# Patient Record
Sex: Male | Born: 1975 | Race: White | Hispanic: No | Marital: Single | Smoking: Current every day smoker
Health system: Southern US, Community
[De-identification: ages and names within clinical notes are randomized; demographics above are authoritative.]

## PROBLEM LIST (undated history)

## (undated) DIAGNOSIS — Z789 Other specified health status: Secondary | ICD-10-CM

## (undated) DIAGNOSIS — K760 Fatty (change of) liver, not elsewhere classified: Secondary | ICD-10-CM

## (undated) DIAGNOSIS — F1011 Alcohol abuse, in remission: Secondary | ICD-10-CM

## (undated) HISTORY — PX: KNEE SURGERY: SHX244

## (undated) HISTORY — DX: Alcohol abuse, in remission: F10.11

## (undated) HISTORY — DX: Fatty (change of) liver, not elsewhere classified: K76.0

---

## 2001-01-24 ENCOUNTER — Emergency Department (HOSPITAL_COMMUNITY): Admission: EM | Admit: 2001-01-24 | Discharge: 2001-01-24 | Payer: Self-pay | Admitting: Emergency Medicine

## 2001-09-28 ENCOUNTER — Emergency Department (HOSPITAL_COMMUNITY): Admission: EM | Admit: 2001-09-28 | Discharge: 2001-09-28 | Payer: Self-pay | Admitting: Emergency Medicine

## 2001-11-28 ENCOUNTER — Emergency Department (HOSPITAL_COMMUNITY): Admission: EM | Admit: 2001-11-28 | Discharge: 2001-11-28 | Payer: Self-pay | Admitting: *Deleted

## 2001-11-28 ENCOUNTER — Encounter: Payer: Self-pay | Admitting: *Deleted

## 2002-10-30 ENCOUNTER — Encounter: Payer: Self-pay | Admitting: Emergency Medicine

## 2002-10-30 ENCOUNTER — Emergency Department (HOSPITAL_COMMUNITY): Admission: AC | Admit: 2002-10-30 | Discharge: 2002-10-30 | Payer: Self-pay

## 2002-11-01 ENCOUNTER — Emergency Department (HOSPITAL_COMMUNITY): Admission: EM | Admit: 2002-11-01 | Discharge: 2002-11-01 | Payer: Self-pay

## 2002-11-06 ENCOUNTER — Emergency Department (HOSPITAL_COMMUNITY): Admission: EM | Admit: 2002-11-06 | Discharge: 2002-11-06 | Payer: Self-pay | Admitting: *Deleted

## 2002-12-04 ENCOUNTER — Emergency Department (HOSPITAL_COMMUNITY): Admission: EM | Admit: 2002-12-04 | Discharge: 2002-12-04 | Payer: Self-pay | Admitting: Emergency Medicine

## 2002-12-04 ENCOUNTER — Encounter: Payer: Self-pay | Admitting: Emergency Medicine

## 2005-01-27 ENCOUNTER — Emergency Department (HOSPITAL_COMMUNITY): Admission: EM | Admit: 2005-01-27 | Discharge: 2005-01-27 | Payer: Self-pay | Admitting: Emergency Medicine

## 2007-06-05 ENCOUNTER — Emergency Department (HOSPITAL_COMMUNITY): Admission: EM | Admit: 2007-06-05 | Discharge: 2007-06-05 | Payer: Self-pay | Admitting: Family Medicine

## 2008-08-10 ENCOUNTER — Emergency Department (HOSPITAL_COMMUNITY): Admission: EM | Admit: 2008-08-10 | Discharge: 2008-08-10 | Payer: Self-pay | Admitting: Family Medicine

## 2008-11-21 ENCOUNTER — Emergency Department (HOSPITAL_COMMUNITY): Admission: EM | Admit: 2008-11-21 | Discharge: 2008-11-21 | Payer: Self-pay | Admitting: Family Medicine

## 2008-12-23 ENCOUNTER — Encounter: Payer: Self-pay | Admitting: Emergency Medicine

## 2008-12-24 ENCOUNTER — Inpatient Hospital Stay (HOSPITAL_COMMUNITY): Admission: RE | Admit: 2008-12-24 | Discharge: 2008-12-27 | Payer: Self-pay

## 2008-12-27 ENCOUNTER — Inpatient Hospital Stay (HOSPITAL_COMMUNITY): Admission: AD | Admit: 2008-12-27 | Discharge: 2008-12-30 | Payer: Self-pay | Admitting: Psychiatry

## 2008-12-27 ENCOUNTER — Ambulatory Visit: Payer: Self-pay | Admitting: Psychiatry

## 2008-12-31 ENCOUNTER — Ambulatory Visit (HOSPITAL_COMMUNITY): Payer: Self-pay | Admitting: Psychology

## 2010-07-11 LAB — URINALYSIS, ROUTINE W REFLEX MICROSCOPIC
Bilirubin Urine: NEGATIVE
Glucose, UA: NEGATIVE mg/dL
Hgb urine dipstick: NEGATIVE
Ketones, ur: NEGATIVE mg/dL
Nitrite: NEGATIVE
Protein, ur: NEGATIVE mg/dL
Specific Gravity, Urine: 1.01 (ref 1.005–1.030)
Urobilinogen, UA: 1 mg/dL (ref 0.0–1.0)
pH: 5.5 (ref 5.0–8.0)

## 2010-07-11 LAB — DIFFERENTIAL
Basophils Absolute: 0.4 10*3/uL — ABNORMAL HIGH (ref 0.0–0.1)
Basophils Relative: 3 % — ABNORMAL HIGH (ref 0–1)
Eosinophils Absolute: 0 10*3/uL (ref 0.0–0.7)
Eosinophils Relative: 0 % (ref 0–5)
Lymphocytes Relative: 10 % — ABNORMAL LOW (ref 12–46)
Lymphs Abs: 1.5 10*3/uL (ref 0.7–4.0)
Monocytes Absolute: 1.9 10*3/uL — ABNORMAL HIGH (ref 0.1–1.0)
Monocytes Relative: 12 % (ref 3–12)
Neutro Abs: 12 10*3/uL — ABNORMAL HIGH (ref 1.7–7.7)
Neutrophils Relative %: 75 % (ref 43–77)

## 2010-07-11 LAB — BASIC METABOLIC PANEL
BUN: 11 mg/dL (ref 6–23)
CO2: 24 mEq/L (ref 19–32)
Calcium: 9.1 mg/dL (ref 8.4–10.5)
Chloride: 102 mEq/L (ref 96–112)
Creatinine, Ser: 0.82 mg/dL (ref 0.4–1.5)
GFR calc Af Amer: >60 mL/min (ref >60)
GFR calc non Af Amer: >60 mL/min (ref >60)
Glucose, Bld: 143 mg/dL — ABNORMAL HIGH (ref 70–99)
Potassium: 3.8 mEq/L (ref 3.5–5.1)
Sodium: 136 mEq/L (ref 135–145)

## 2010-07-11 LAB — CBC
HCT: 45 % (ref 39.0–52.0)
HCT: 48 % (ref 39.0–52.0)
Hemoglobin: 15.3 g/dL (ref 13.0–17.0)
Hemoglobin: 16.4 g/dL (ref 13.0–17.0)
MCHC: 33.9 g/dL (ref 30.0–36.0)
MCHC: 34.3 g/dL (ref 30.0–36.0)
MCV: 89.3 fL (ref 78.0–100.0)
MCV: 89.6 fL (ref 78.0–100.0)
Platelets: 182 10*3/uL (ref 150–400)
Platelets: 205 10*3/uL (ref 150–400)
RBC: 5.04 MIL/uL (ref 4.22–5.81)
RBC: 5.36 MIL/uL (ref 4.22–5.81)
RDW: 13.6 % (ref 11.5–15.5)
RDW: 12.8 % (ref 11.5–15.5)
WBC: 8.6 10*3/uL (ref 4.0–10.5)
WBC: 15.8 10*3/uL — ABNORMAL HIGH (ref 4.0–10.5)

## 2010-07-11 LAB — URINE CULTURE
Colony Count: NO GROWTH
Culture: NO GROWTH

## 2010-07-11 LAB — RAPID URINE DRUG SCREEN, HOSP PERFORMED
Amphetamines: NOT DETECTED
Barbiturates: NOT DETECTED
Benzodiazepines: POSITIVE — AB
Cocaine: NOT DETECTED
Opiates: NOT DETECTED
Tetrahydrocannabinol: POSITIVE — AB

## 2010-07-11 LAB — ETHANOL: Alcohol, Ethyl (B): <5 mg/dL (ref 0–10)

## 2010-07-11 LAB — TRICYCLICS SCREEN, URINE: TCA Scrn: NOT DETECTED

## 2012-06-17 ENCOUNTER — Emergency Department (HOSPITAL_COMMUNITY)
Admission: EM | Admit: 2012-06-17 | Discharge: 2012-06-17 | Disposition: A | Discharge status: Home / Self Care | Payer: Self-pay | Attending: Emergency Medicine | Admitting: Emergency Medicine

## 2012-06-17 ENCOUNTER — Encounter (HOSPITAL_COMMUNITY): Payer: Self-pay | Admitting: Cardiology

## 2012-06-17 DIAGNOSIS — K113 Abscess of salivary gland: Secondary | ICD-10-CM | POA: Insufficient documentation

## 2012-06-17 DIAGNOSIS — F17200 Nicotine dependence, unspecified, uncomplicated: Secondary | ICD-10-CM | POA: Insufficient documentation

## 2012-06-17 DIAGNOSIS — K029 Dental caries, unspecified: Secondary | ICD-10-CM | POA: Insufficient documentation

## 2012-06-17 MED ORDER — CEPHALEXIN 500 MG PO CAPS: 500.0000 mg | ORAL_CAPSULE | Freq: Four times a day (QID) | ORAL | Status: DC

## 2012-06-17 NOTE — ED Provider Notes (Signed)
History     CSN: [id number]  Arrival date & time 06/17/12  1191   First MD Initiated Contact with Patient 06/17/12 1116      Chief Complaint  Patient presents with  . Abscess    (Consider location/radiation/quality/duration/timing/severity/associated sxs/prior treatment) HPI Comments: Patient presents today with a chief complaint of a right submandibular mass.  Mass has been present for the past 2 weeks and is gradually becoming larger.  Area is tender.  He has taken Tylenol for the pain without relief.  He has never had anything like this before.  No fever or chills.  No trismus.  No difficulty swallowing.  No drooling.  No dental pain..    The history is provided by the patient.    History reviewed. No pertinent past medical history.  History reviewed. No pertinent past surgical history.  History reviewed. No pertinent family history.  History  Substance Use Topics  . Smoking status: Current Every Day Smoker  . Smokeless tobacco: Not on file  . Alcohol Use: Yes      Review of Systems  Constitutional: Negative for fever and chills.  HENT: Positive for dental problem. Negative for drooling, trouble swallowing, neck pain and neck stiffness.     Allergies  Review of patient's allergies indicates no known allergies.  Home Medications   Current Outpatient Rx  Name  Route  Sig  Dispense  Refill  . acetaminophen (TYLENOL) 325 MG tablet   Oral   Take 650 mg by mouth every 6 (six) hours as needed for pain.         Marland Kitchen ibuprofen (ADVIL,MOTRIN) 200 MG tablet   Oral   Take 400 mg by mouth every 6 (six) hours as needed for pain.           BP 126/82  Pulse 72  Temp(Src) 97.5 F (36.4 C) (Oral)  Resp 18  SpO2 97%  Physical Exam  Nursing note and vitals reviewed. Constitutional: He appears well-developed and well-nourished.  HENT:  Head: Normocephalic and atraumatic. No trismus in the jaw.  Mouth/Throat: Uvula is midline, oropharynx is clear and moist and  mucous membranes are normal. No oral lesions. Dental caries present. No dental abscesses or edematous.  Approximately 3 cm salivary gland abscess in the right submandibular region No trismus No dental abscess No tongue elevation or sublingual tenderness. No submental or submandibular lymphadenopathy  Neck: Normal range of motion. Neck supple.  Cardiovascular: Normal rate, regular rhythm and normal heart sounds.   Pulmonary/Chest: Effort normal and breath sounds normal.  Musculoskeletal: Normal range of motion.  Neurological: He is alert.  Skin: Skin is warm and dry.  Psychiatric: He has a normal mood and affect.    ED Course  Procedures (including critical care time)  Labs Reviewed - No data to display No results found.   No diagnosis found.  Patient also evaluated by Dr. Effie Shy  MDM  Patient presenting today with a submandibular mass.  Appearance consistent with salivary gland abscess.  Patient afebrile.  No trismus.  No difficulty swallowing.  Patient started on Keflex and instructed to suck on lemon candies.  Patient also given referral to ENT if symptoms do not improve.        Pascal Lux [place], PA-C 06/18/12 1857

## 2012-06-17 NOTE — ED Notes (Signed)
Pt reports abscess to the right side of his neck for the past 2 weeks. States he has taken OTC medication without relief. Denies any difficulty swallowing. No fevers at home.

## 2012-06-19 NOTE — ED Provider Notes (Signed)
Medical screening examination/treatment/procedure(s) were performed by non-physician practitioner and as supervising physician I was immediately available for consultation/collaboration.  Elliott L Wentz, MD 06/19/12 0859 

## 2014-04-01 ENCOUNTER — Emergency Department (HOSPITAL_COMMUNITY): Payer: Self-pay

## 2014-04-01 ENCOUNTER — Encounter (HOSPITAL_COMMUNITY): Payer: Self-pay | Admitting: Physical Medicine and Rehabilitation

## 2014-04-01 ENCOUNTER — Inpatient Hospital Stay (HOSPITAL_COMMUNITY)
Admission: EM | Admit: 2014-04-01 | Discharge: 2014-04-03 | DRG: 605 | Disposition: A | Discharge status: Home / Self Care | Payer: Self-pay | Attending: Surgery | Admitting: Surgery

## 2014-04-01 DIAGNOSIS — F1290 Cannabis use, unspecified, uncomplicated: Secondary | ICD-10-CM | POA: Diagnosis present

## 2014-04-01 DIAGNOSIS — Z9119 Patient's noncompliance with other medical treatment and regimen: Secondary | ICD-10-CM | POA: Diagnosis present

## 2014-04-01 DIAGNOSIS — S129XXA Fracture of neck, unspecified, initial encounter: Secondary | ICD-10-CM | POA: Diagnosis present

## 2014-04-01 DIAGNOSIS — S0101XA Laceration without foreign body of scalp, initial encounter: Principal | ICD-10-CM | POA: Diagnosis present

## 2014-04-01 DIAGNOSIS — F17210 Nicotine dependence, cigarettes, uncomplicated: Secondary | ICD-10-CM | POA: Diagnosis present

## 2014-04-01 DIAGNOSIS — S0292XA Unspecified fracture of facial bones, initial encounter for closed fracture: Secondary | ICD-10-CM

## 2014-04-01 DIAGNOSIS — S028XXA Fractures of other specified skull and facial bones, initial encounter for closed fracture: Secondary | ICD-10-CM | POA: Diagnosis present

## 2014-04-01 DIAGNOSIS — S12200A Unspecified displaced fracture of third cervical vertebra, initial encounter for closed fracture: Secondary | ICD-10-CM | POA: Diagnosis present

## 2014-04-01 HISTORY — DX: Other specified health status: Z78.9

## 2014-04-01 LAB — COMPREHENSIVE METABOLIC PANEL
ALBUMIN: 4.4 g/dL (ref 3.5–5.2)
ALT: 32 U/L (ref 0–53)
ANION GAP: 10 (ref 5–15)
AST: 48 U/L — ABNORMAL HIGH (ref 0–37)
Alkaline Phosphatase: 71 U/L (ref 39–117)
BUN: 7 mg/dL (ref 6–23)
CALCIUM: 9 mg/dL (ref 8.4–10.5)
CO2: 23 mmol/L (ref 19–32)
CREATININE: 0.77 mg/dL (ref 0.50–1.35)
Chloride: 104 mEq/L (ref 96–112)
GFR calc Af Amer: >90 mL/min (ref >90)
GFR calc non Af Amer: >90 mL/min (ref >90)
GLUCOSE: 91 mg/dL (ref 70–99)
Potassium: 3.6 mmol/L (ref 3.5–5.1)
Sodium: 137 mmol/L (ref 135–145)
TOTAL PROTEIN: 6.9 g/dL (ref 6.0–8.3)
Total Bilirubin: 0.2 mg/dL — ABNORMAL LOW (ref 0.3–1.2)

## 2014-04-01 LAB — I-STAT CHEM 8, ED
BUN: 8 mg/dL (ref 6–23)
CALCIUM ION: 1.11 mmol/L — AB (ref 1.12–1.23)
Chloride: 102 mEq/L (ref 96–112)
Creatinine, Ser: 1.1 mg/dL (ref 0.50–1.35)
GLUCOSE: 90 mg/dL (ref 70–99)
HEMATOCRIT: 49 % (ref 39.0–52.0)
Hemoglobin: 16.7 g/dL (ref 13.0–17.0)
Potassium: 3.5 mmol/L (ref 3.5–5.1)
Sodium: 140 mmol/L (ref 135–145)
TCO2: 20 mmol/L (ref 0–100)

## 2014-04-01 LAB — CBC
HEMATOCRIT: 43.6 % (ref 39.0–52.0)
HEMOGLOBIN: 15 g/dL (ref 13.0–17.0)
MCH: 29.5 pg (ref 26.0–34.0)
MCHC: 34.4 g/dL (ref 30.0–36.0)
MCV: 85.8 fL (ref 78.0–100.0)
Platelets: 254 10*3/uL (ref 150–400)
RBC: 5.08 MIL/uL (ref 4.22–5.81)
RDW: 13.1 % (ref 11.5–15.5)
WBC: 11 10*3/uL — AB (ref 4.0–10.5)

## 2014-04-01 LAB — SAMPLE TO BLOOD BANK

## 2014-04-01 LAB — PROTIME-INR
INR: 1.03 (ref 0.00–1.49)
PROTHROMBIN TIME: 13.6 s (ref 11.6–15.2)

## 2014-04-01 LAB — ETHANOL: ALCOHOL ETHYL (B): 200 mg/dL — AB (ref 0–9)

## 2014-04-01 MED ORDER — CEFAZOLIN SODIUM-DEXTROSE 2-3 GM-% IV SOLR
INTRAVENOUS | Status: AC
  Filled 2014-04-01: qty 50

## 2014-04-01 MED ORDER — DEXAMETHASONE SODIUM PHOSPHATE 10 MG/ML IJ SOLN
10.0000 mg | Freq: Once | INTRAMUSCULAR | Status: AC
  Administered 2014-04-01: 10 mg via INTRAVENOUS
  Filled 2014-04-01: qty 1

## 2014-04-01 MED ORDER — FENTANYL CITRATE 0.05 MG/ML IJ SOLN
50.0000 ug | Freq: Once | INTRAMUSCULAR | Status: AC
  Administered 2014-04-01: 50 ug via INTRAVENOUS

## 2014-04-01 MED ORDER — FENTANYL CITRATE 0.05 MG/ML IJ SOLN
25.0000 ug | Freq: Once | INTRAMUSCULAR | Status: AC
  Administered 2014-04-01: 25 ug via INTRAVENOUS
  Filled 2014-04-01: qty 2

## 2014-04-01 MED ORDER — MORPHINE SULFATE 4 MG/ML IJ SOLN
4.0000 mg | INTRAMUSCULAR | Status: DC | PRN
  Administered 2014-04-02 – 2014-04-03 (×9): 4 mg via INTRAVENOUS
  Filled 2014-04-01 (×9): qty 1

## 2014-04-01 MED ORDER — HYDROMORPHONE HCL 1 MG/ML IJ SOLN
1.0000 mg | INTRAMUSCULAR | Status: DC | PRN
  Administered 2014-04-01: 1 mg via INTRAVENOUS

## 2014-04-01 MED ORDER — HYDROMORPHONE HCL 1 MG/ML IJ SOLN
INTRAMUSCULAR | Status: AC
  Filled 2014-04-01: qty 1

## 2014-04-01 MED ORDER — SODIUM CHLORIDE 0.9 % IV SOLN
Freq: Once | INTRAVENOUS | Status: AC
  Administered 2014-04-01: 20:00:00 via INTRAVENOUS

## 2014-04-01 MED ORDER — IOHEXOL 350 MG/ML SOLN
100.0000 mL | Freq: Once | INTRAVENOUS | Status: AC | PRN
  Administered 2014-04-01: 100 mL via INTRAVENOUS

## 2014-04-01 MED ORDER — CEFAZOLIN SODIUM 1-5 GM-% IV SOLN
1.0000 g | Freq: Once | INTRAVENOUS | Status: AC
  Administered 2014-04-01: 1 g via INTRAVENOUS
  Filled 2014-04-01: qty 50

## 2014-04-01 MED ORDER — SODIUM CHLORIDE 0.9 % IV SOLN
INTRAVENOUS | Status: DC
  Administered 2014-04-02: 05:00:00 via INTRAVENOUS

## 2014-04-01 MED ORDER — LIDOCAINE-EPINEPHRINE 1 %-1:100000 IJ SOLN
10.0000 mL | Freq: Once | INTRAMUSCULAR | Status: AC
  Administered 2014-04-01: 1 mL
  Filled 2014-04-01: qty 1

## 2014-04-01 MED ORDER — MORPHINE SULFATE 2 MG/ML IJ SOLN
2.0000 mg | INTRAMUSCULAR | Status: DC | PRN
  Administered 2014-04-02 (×2): 2 mg via INTRAVENOUS
  Filled 2014-04-01 (×2): qty 1

## 2014-04-01 MED ORDER — TETANUS-DIPHTHERIA TOXOIDS TD 5-2 LFU IM INJ
0.5000 mL | INJECTION | Freq: Once | INTRAMUSCULAR | Status: AC
  Administered 2014-04-01: 0.5 mL via INTRAMUSCULAR
  Filled 2014-04-01: qty 0.5
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
## 2014-04-01 NOTE — ED Notes (Signed)
Pt transported to CT scan and x-ray. 
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
## 2014-04-01 NOTE — ED Notes (Signed)
MD at bedside. 

## 2014-04-02 ENCOUNTER — Encounter (HOSPITAL_COMMUNITY): Payer: Self-pay | Admitting: *Deleted

## 2014-04-02 LAB — BASIC METABOLIC PANEL
Anion gap: 11 (ref 5–15)
BUN: 9 mg/dL (ref 6–23)
CO2: 22 mmol/L (ref 19–32)
CREATININE: 0.77 mg/dL (ref 0.50–1.35)
Calcium: 8.8 mg/dL (ref 8.4–10.5)
Chloride: 105 mEq/L (ref 96–112)
Glucose, Bld: 120 mg/dL — ABNORMAL HIGH (ref 70–99)
POTASSIUM: 4.3 mmol/L (ref 3.5–5.1)
Sodium: 138 mmol/L (ref 135–145)

## 2014-04-02 LAB — CBC
HCT: 42.6 % (ref 39.0–52.0)
Hemoglobin: 14.2 g/dL (ref 13.0–17.0)
MCH: 28.7 pg (ref 26.0–34.0)
MCHC: 33.3 g/dL (ref 30.0–36.0)
MCV: 86.2 fL (ref 78.0–100.0)
Platelets: 223 10*3/uL (ref 150–400)
RBC: 4.94 MIL/uL (ref 4.22–5.81)
RDW: 13.2 % (ref 11.5–15.5)
WBC: 14 10*3/uL — ABNORMAL HIGH (ref 4.0–10.5)

## 2014-04-02 LAB — URINALYSIS, ROUTINE W REFLEX MICROSCOPIC
Bilirubin Urine: NEGATIVE
GLUCOSE, UA: NEGATIVE mg/dL
Hgb urine dipstick: NEGATIVE
Ketones, ur: NEGATIVE mg/dL
Leukocytes, UA: NEGATIVE
NITRITE: NEGATIVE
PH: 5 (ref 5.0–8.0)
Protein, ur: NEGATIVE mg/dL
Specific Gravity, Urine: 1.01 (ref 1.005–1.030)
Urobilinogen, UA: 0.2 mg/dL (ref 0.0–1.0)

## 2014-04-02 LAB — CDS SEROLOGY

## 2014-04-02 LAB — MRSA PCR SCREENING: MRSA by PCR: POSITIVE — AB

## 2014-04-02 MED ORDER — PNEUMOCOCCAL VAC POLYVALENT 25 MCG/0.5ML IJ INJ
0.5000 mL | INJECTION | INTRAMUSCULAR | Status: DC
  Filled 2014-04-02: qty 0.5

## 2014-04-02 MED ORDER — MUPIROCIN 2 % EX OINT
1.0000 "application " | TOPICAL_OINTMENT | Freq: Two times a day (BID) | CUTANEOUS | Status: DC
  Administered 2014-04-02 – 2014-04-03 (×2): 1 via NASAL
  Filled 2014-04-02 (×2): qty 22

## 2014-04-02 MED ORDER — SODIUM CHLORIDE 0.9 % IV SOLN
INTRAVENOUS | Status: DC
  Administered 2014-04-02: 17:00:00 via INTRAVENOUS

## 2014-04-02 MED ORDER — ONDANSETRON HCL 4 MG/2ML IJ SOLN: 4.0000 mg | Freq: Four times a day (QID) | INTRAMUSCULAR | Status: DC | PRN

## 2014-04-02 MED ORDER — INFLUENZA VAC SPLIT QUAD 0.5 ML IM SUSY
0.5000 mL | PREFILLED_SYRINGE | INTRAMUSCULAR | Status: DC
  Filled 2014-04-02: qty 0.5

## 2014-04-02 MED ORDER — ONDANSETRON HCL 4 MG PO TABS: 4.0000 mg | ORAL_TABLET | Freq: Four times a day (QID) | ORAL | Status: DC | PRN

## 2014-04-02 MED ORDER — CHLORHEXIDINE GLUCONATE CLOTH 2 % EX PADS
6.0000 | MEDICATED_PAD | Freq: Every day | CUTANEOUS | Status: DC
  Administered 2014-04-02 – 2014-04-03 (×2): 6 via TOPICAL

## 2014-04-02 NOTE — Clinical Social Work Note (Signed)
CSW attempted to see the pt. The pt requested the CSW to come back at a later time. Pt informed the CSW that he wanted to get some sleep. CSW will revisit the pt.   Levi Klaiber, MSW, LCSWA [phone]
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
## 2014-04-02 NOTE — Progress Notes (Signed)
Patient removed his aspen collar against this nurse advice since spine status not cleared yet, and patient stated,"I cannot sleep with this collar on.  I am ok.  I need sleep".
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
## 2014-04-02 NOTE — Progress Notes (Signed)
At 0027, patient transferred from ER via stretcher by ER RN. Patient confused and stating he needed to urinate. Bladder scan performed >999. Dr. Magnus IvanBlackman notified. In and out cath performed with 1250cc clear, yellow urine returned. Patient more comfortable. Family updated and at bedside. Oriented to unit and room. Patient instructed on callbell and placed at side. Bed alarm on. Will continue to monitor.
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
## 2014-04-03 DIAGNOSIS — S129XXA Fracture of neck, unspecified, initial encounter: Secondary | ICD-10-CM | POA: Diagnosis present

## 2014-04-03 DIAGNOSIS — S0101XA Laceration without foreign body of scalp, initial encounter: Secondary | ICD-10-CM | POA: Diagnosis present

## 2014-04-03 MED ORDER — MORPHINE SULFATE 2 MG/ML IJ SOLN: 2.0000 mg | INTRAMUSCULAR | Status: DC | PRN

## 2014-04-03 MED ORDER — TOBRAMYCIN-DEXAMETHASONE 0.3-0.1 % OP OINT: 1.0000 "application " | TOPICAL_OINTMENT | Freq: Two times a day (BID) | OPHTHALMIC | Status: DC

## 2014-04-03 MED ORDER — HYDROCODONE-ACETAMINOPHEN 10-325 MG PO TABS
0.5000 | ORAL_TABLET | ORAL | Status: DC | PRN
  Administered 2014-04-03 (×2): 2 via ORAL
  Filled 2014-04-03 (×2): qty 2

## 2014-04-03 MED ORDER — HYDROCODONE-ACETAMINOPHEN 10-325 MG PO TABS: 1.0000 | ORAL_TABLET | ORAL | Status: DC | PRN

## 2014-04-03 NOTE — Progress Notes (Signed)
Patient and his sister given discharge instructions.  They verbalized understanding of all instructions and follow-up information.  Med assistance paperwork was already given to pt by Ilean SkillMichele Bryson, RN Case Mgr.  Dressings changed on Rt elbow and Rt knee before discharge.  No questions or concerns at this time.  Pt taken down via wheelchair to go home with his sister.

## 2014-04-03 NOTE — Discharge Instructions (Signed)
Wear neck collar at all times.  No driving while taking hydrocodone.
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
## 2014-04-03 NOTE — Progress Notes (Signed)
Put back on aspen collar to patient.
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
## 2014-04-09 ENCOUNTER — Telehealth (HOSPITAL_COMMUNITY): Payer: Self-pay

## 2014-04-09 NOTE — Telephone Encounter (Signed)
Pt called to say he was itchy and had a rash with the hydrocodone. I offered to switch him to something non-narcotic but the patient didn't think it was too bad. He is going to take some benadryl with it. I instructed him to stop taking it immediately if things got worse and call.

## 2015-10-20 ENCOUNTER — Inpatient Hospital Stay (HOSPITAL_COMMUNITY)
Admission: EM | Admit: 2015-10-20 | Discharge: 2015-10-23 | DRG: 494 | Disposition: A | Discharge status: Home / Self Care | Payer: Medicaid Other | Attending: Orthopedic Surgery | Admitting: Orthopedic Surgery

## 2015-10-20 DIAGNOSIS — F1290 Cannabis use, unspecified, uncomplicated: Secondary | ICD-10-CM | POA: Diagnosis present

## 2015-10-20 DIAGNOSIS — Z8781 Personal history of (healed) traumatic fracture: Secondary | ICD-10-CM

## 2015-10-20 DIAGNOSIS — F10129 Alcohol abuse with intoxication, unspecified: Secondary | ICD-10-CM | POA: Diagnosis present

## 2015-10-20 DIAGNOSIS — Y906 Blood alcohol level of 120-199 mg/100 ml: Secondary | ICD-10-CM | POA: Diagnosis present

## 2015-10-20 DIAGNOSIS — Z9889 Other specified postprocedural states: Secondary | ICD-10-CM

## 2015-10-20 DIAGNOSIS — S82209A Unspecified fracture of shaft of unspecified tibia, initial encounter for closed fracture: Secondary | ICD-10-CM | POA: Diagnosis present

## 2015-10-20 DIAGNOSIS — S82202B Unspecified fracture of shaft of left tibia, initial encounter for open fracture type I or II: Secondary | ICD-10-CM | POA: Diagnosis present

## 2015-10-20 DIAGNOSIS — S82402B Unspecified fracture of shaft of left fibula, initial encounter for open fracture type I or II: Principal | ICD-10-CM | POA: Diagnosis present

## 2015-10-20 DIAGNOSIS — F17210 Nicotine dependence, cigarettes, uncomplicated: Secondary | ICD-10-CM | POA: Diagnosis present

## 2015-10-20 MED ORDER — SODIUM CHLORIDE 0.9 % IV BOLUS (SEPSIS)
2000.0000 mL | Freq: Once | INTRAVENOUS | Status: AC
Start: 2015-10-21 — End: 2015-10-21
  Administered 2015-10-21: 2000 mL via INTRAVENOUS

## 2015-10-20 MED ORDER — FENTANYL CITRATE (PF) 100 MCG/2ML IJ SOLN
100.0000 ug | Freq: Once | INTRAMUSCULAR | Status: AC
  Administered 2015-10-21: 100 ug via INTRAVENOUS
  Filled 2015-10-20: qty 2
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
## 2015-10-21 ENCOUNTER — Emergency Department (HOSPITAL_COMMUNITY): Payer: Medicaid Other

## 2015-10-21 ENCOUNTER — Encounter (HOSPITAL_COMMUNITY): Payer: Self-pay | Admitting: Emergency Medicine

## 2015-10-21 ENCOUNTER — Inpatient Hospital Stay (HOSPITAL_COMMUNITY): Payer: Medicaid Other | Admitting: Certified Registered"

## 2015-10-21 ENCOUNTER — Inpatient Hospital Stay (HOSPITAL_COMMUNITY): Payer: Medicaid Other

## 2015-10-21 ENCOUNTER — Encounter (HOSPITAL_COMMUNITY)
Admission: EM | Disposition: A | Discharge status: Home / Self Care | Payer: Self-pay | Source: Home / Self Care | Attending: Orthopedic Surgery

## 2015-10-21 DIAGNOSIS — F1290 Cannabis use, unspecified, uncomplicated: Secondary | ICD-10-CM | POA: Diagnosis present

## 2015-10-21 DIAGNOSIS — S82202B Unspecified fracture of shaft of left tibia, initial encounter for open fracture type I or II: Secondary | ICD-10-CM | POA: Diagnosis present

## 2015-10-21 DIAGNOSIS — S82402B Unspecified fracture of shaft of left fibula, initial encounter for open fracture type I or II: Secondary | ICD-10-CM | POA: Diagnosis present

## 2015-10-21 DIAGNOSIS — F17210 Nicotine dependence, cigarettes, uncomplicated: Secondary | ICD-10-CM | POA: Diagnosis present

## 2015-10-21 DIAGNOSIS — Y906 Blood alcohol level of 120-199 mg/100 ml: Secondary | ICD-10-CM | POA: Diagnosis present

## 2015-10-21 DIAGNOSIS — S82209A Unspecified fracture of shaft of unspecified tibia, initial encounter for closed fracture: Secondary | ICD-10-CM | POA: Diagnosis present

## 2015-10-21 DIAGNOSIS — F10129 Alcohol abuse with intoxication, unspecified: Secondary | ICD-10-CM | POA: Diagnosis present

## 2015-10-21 HISTORY — PX: I & D EXTREMITY: SHX5045

## 2015-10-21 HISTORY — PX: TIBIA IM NAIL INSERTION: SHX2516

## 2015-10-21 LAB — CBG MONITORING, ED: Glucose-Capillary: 101 mg/dL — ABNORMAL HIGH (ref 65–99)

## 2015-10-21 LAB — CBC WITH DIFFERENTIAL/PLATELET
EOS PCT: 2 %
HCT: 44.9 % (ref 39.0–52.0)
Hemoglobin: 15.1 g/dL (ref 13.0–17.0)
Lymphocytes Relative: 40 %
MCH: 29.4 pg (ref 26.0–34.0)
MCHC: 33.6 g/dL (ref 30.0–36.0)
MCV: 87.4 fL (ref 78.0–100.0)
MONOS PCT: 6 %
NEUTROS PCT: 52 %
PLATELETS: 301 10*3/uL (ref 150–400)
RBC: 5.14 MIL/uL (ref 4.22–5.81)
RDW: 13.2 % (ref 11.5–15.5)
WBC: 14.4 10*3/uL — AB (ref 4.0–10.5)

## 2015-10-21 LAB — COMPREHENSIVE METABOLIC PANEL
ALBUMIN: 4.5 g/dL (ref 3.5–5.0)
ALT: 27 U/L (ref 17–63)
ANION GAP: 11 (ref 5–15)
AST: 50 U/L — AB (ref 15–41)
Alkaline Phosphatase: 70 U/L (ref 38–126)
BUN: 15 mg/dL (ref 6–20)
CALCIUM: 8.9 mg/dL (ref 8.9–10.3)
CHLORIDE: 107 mmol/L (ref 101–111)
CO2: 22 mmol/L (ref 22–32)
Creatinine, Ser: 0.79 mg/dL (ref 0.61–1.24)
GFR calc Af Amer: >60 mL/min (ref >60)
Glucose, Bld: 96 mg/dL (ref 65–99)
POTASSIUM: 4.1 mmol/L (ref 3.5–5.1)
Sodium: 140 mmol/L (ref 135–145)
Total Bilirubin: 0.7 mg/dL (ref 0.3–1.2)
Total Protein: 7.2 g/dL (ref 6.5–8.1)

## 2015-10-21 LAB — I-STAT CHEM 8, ED
BUN: 18 mg/dL (ref 6–20)
CHLORIDE: 104 mmol/L (ref 101–111)
Calcium, Ion: 1.07 mmol/L — ABNORMAL LOW (ref 1.13–1.30)
Creatinine, Ser: 0.9 mg/dL (ref 0.61–1.24)
Glucose, Bld: 94 mg/dL (ref 65–99)
HEMATOCRIT: 46 % (ref 39.0–52.0)
Hemoglobin: 15.6 g/dL (ref 13.0–17.0)
POTASSIUM: 4 mmol/L (ref 3.5–5.1)
SODIUM: 141 mmol/L (ref 135–145)
TCO2: 21 mmol/L (ref 0–100)

## 2015-10-21 LAB — URINE MICROSCOPIC-ADD ON

## 2015-10-21 LAB — RAPID URINE DRUG SCREEN, HOSP PERFORMED
AMPHETAMINES: NOT DETECTED
Barbiturates: NOT DETECTED
Benzodiazepines: NOT DETECTED
Cocaine: NOT DETECTED
OPIATES: POSITIVE — AB
Tetrahydrocannabinol: POSITIVE — AB

## 2015-10-21 LAB — URINALYSIS, ROUTINE W REFLEX MICROSCOPIC
BILIRUBIN URINE: NEGATIVE
Glucose, UA: NEGATIVE mg/dL
KETONES UR: 15 mg/dL — AB
Leukocytes, UA: NEGATIVE
NITRITE: NEGATIVE
PROTEIN: NEGATIVE mg/dL
SPECIFIC GRAVITY, URINE: 1.026 (ref 1.005–1.030)
pH: 5 (ref 5.0–8.0)

## 2015-10-21 LAB — CBC
HEMATOCRIT: 37.1 % — AB (ref 39.0–52.0)
HEMOGLOBIN: 12.3 g/dL — AB (ref 13.0–17.0)
MCH: 29.1 pg (ref 26.0–34.0)
MCHC: 33.2 g/dL (ref 30.0–36.0)
MCV: 87.7 fL (ref 78.0–100.0)
Platelets: 252 10*3/uL (ref 150–400)
RBC: 4.23 MIL/uL (ref 4.22–5.81)
RDW: 13.4 % (ref 11.5–15.5)
WBC: 16.7 10*3/uL — AB (ref 4.0–10.5)

## 2015-10-21 LAB — LIPASE, BLOOD: LIPASE: 50 U/L (ref 11–51)

## 2015-10-21 LAB — I-STAT CG4 LACTIC ACID, ED: Lactic Acid, Venous: 3.02 mmol/L (ref 0.5–1.9)

## 2015-10-21 LAB — CREATININE, SERUM: Creatinine, Ser: 0.75 mg/dL (ref 0.61–1.24)

## 2015-10-21 LAB — GLUCOSE, CAPILLARY: Glucose-Capillary: 170 mg/dL — ABNORMAL HIGH (ref 65–99)

## 2015-10-21 LAB — MRSA PCR SCREENING: MRSA by PCR: NEGATIVE

## 2015-10-21 LAB — ETHANOL: ALCOHOL ETHYL (B): 197 mg/dL — AB (ref <5)

## 2015-10-21 SURGERY — INSERTION, INTRAMEDULLARY ROD, TIBIA
Anesthesia: General | Site: Leg Lower | Laterality: Left

## 2015-10-21 MED ORDER — DEXAMETHASONE SODIUM PHOSPHATE 10 MG/ML IJ SOLN
INTRAMUSCULAR | Status: DC | PRN
  Administered 2015-10-21: 10 mg via INTRAVENOUS

## 2015-10-21 MED ORDER — METHOCARBAMOL 500 MG PO TABS
ORAL_TABLET | ORAL | Status: AC
  Filled 2015-10-21: qty 1

## 2015-10-21 MED ORDER — SUFENTANIL CITRATE 50 MCG/ML IV SOLN
INTRAVENOUS | Status: DC | PRN
  Administered 2015-10-21: 15 ug via INTRAVENOUS
  Administered 2015-10-21: 5 ug via INTRAVENOUS

## 2015-10-21 MED ORDER — HYDROCODONE-ACETAMINOPHEN 5-325 MG PO TABS: 1.0000 | ORAL_TABLET | Freq: Four times a day (QID) | ORAL | Status: DC | PRN

## 2015-10-21 MED ORDER — HYDROMORPHONE HCL 1 MG/ML IJ SOLN
INTRAMUSCULAR | Status: AC
  Filled 2015-10-21: qty 1

## 2015-10-21 MED ORDER — OXYCODONE HCL 5 MG PO TABS
5.0000 mg | ORAL_TABLET | ORAL | Status: DC | PRN
  Administered 2015-10-21 – 2015-10-23 (×16): 10 mg via ORAL
  Filled 2015-10-21 (×16): qty 2

## 2015-10-21 MED ORDER — IOPAMIDOL (ISOVUE-300) INJECTION 61%
INTRAVENOUS | Status: AC
  Administered 2015-10-21: 100 mL
  Filled 2015-10-21: qty 100

## 2015-10-21 MED ORDER — POLYETHYLENE GLYCOL 3350 17 G PO PACK: 17.0000 g | PACK | Freq: Every day | ORAL | Status: DC | PRN

## 2015-10-21 MED ORDER — OXYCODONE HCL 5 MG PO TABS
ORAL_TABLET | ORAL | Status: AC
  Filled 2015-10-21: qty 2

## 2015-10-21 MED ORDER — CEFAZOLIN SODIUM-DEXTROSE 2-4 GM/100ML-% IV SOLN
2.0000 g | Freq: Three times a day (TID) | INTRAVENOUS | Status: AC
  Administered 2015-10-21 – 2015-10-23 (×6): 2 g via INTRAVENOUS
  Filled 2015-10-21 (×6): qty 100

## 2015-10-21 MED ORDER — SILVER SULFADIAZINE 1 % EX CREA
TOPICAL_CREAM | Freq: Two times a day (BID) | CUTANEOUS | Status: DC
  Administered 2015-10-21 – 2015-10-23 (×4): via TOPICAL
  Filled 2015-10-21: qty 85

## 2015-10-21 MED ORDER — BISACODYL 10 MG RE SUPP: 10.0000 mg | Freq: Every day | RECTAL | Status: DC | PRN

## 2015-10-21 MED ORDER — ONDANSETRON HCL 4 MG PO TABS: 4.0000 mg | ORAL_TABLET | Freq: Four times a day (QID) | ORAL | Status: DC | PRN

## 2015-10-21 MED ORDER — PROPOFOL 10 MG/ML IV BOLUS
INTRAVENOUS | Status: AC
  Filled 2015-10-21: qty 20

## 2015-10-21 MED ORDER — PROMETHAZINE HCL 25 MG/ML IJ SOLN: 6.2500 mg | INTRAMUSCULAR | Status: DC | PRN

## 2015-10-21 MED ORDER — ACETAMINOPHEN 325 MG PO TABS
650.0000 mg | ORAL_TABLET | Freq: Four times a day (QID) | ORAL | Status: DC | PRN
  Administered 2015-10-21 – 2015-10-22 (×3): 650 mg via ORAL
  Filled 2015-10-21 (×3): qty 2

## 2015-10-21 MED ORDER — METHOCARBAMOL 500 MG PO TABS: 500.0000 mg | ORAL_TABLET | Freq: Three times a day (TID) | ORAL | Status: DC | PRN

## 2015-10-21 MED ORDER — TETANUS-DIPHTH-ACELL PERTUSSIS 5-2.5-18.5 LF-MCG/0.5 IM SUSP: 0.5000 mL | Freq: Once | INTRAMUSCULAR | Status: DC

## 2015-10-21 MED ORDER — LACTATED RINGERS IV SOLN
INTRAVENOUS | Status: DC | PRN
Start: 2015-10-21 — End: 2015-10-21
  Administered 2015-10-21 (×2): via INTRAVENOUS

## 2015-10-21 MED ORDER — SODIUM CHLORIDE 0.9 % IR SOLN
Status: DC | PRN
  Administered 2015-10-21: 3000 mL
  Administered 2015-10-21: 1000 mL

## 2015-10-21 MED ORDER — DOCUSATE SODIUM 100 MG PO CAPS
100.0000 mg | ORAL_CAPSULE | Freq: Two times a day (BID) | ORAL | Status: DC
  Administered 2015-10-21 – 2015-10-23 (×5): 100 mg via ORAL
  Filled 2015-10-21 (×5): qty 1

## 2015-10-21 MED ORDER — ROCURONIUM BROMIDE 100 MG/10ML IV SOLN
INTRAVENOUS | Status: DC | PRN
  Administered 2015-10-21: 20 mg via INTRAVENOUS

## 2015-10-21 MED ORDER — LIDOCAINE 2% (20 MG/ML) 5 ML SYRINGE
INTRAMUSCULAR | Status: DC | PRN
  Administered 2015-10-21: 100 mg via INTRAVENOUS

## 2015-10-21 MED ORDER — SUFENTANIL CITRATE 50 MCG/ML IV SOLN
INTRAVENOUS | Status: AC
  Filled 2015-10-21: qty 1

## 2015-10-21 MED ORDER — ONDANSETRON HCL 4 MG/2ML IJ SOLN: 4.0000 mg | Freq: Four times a day (QID) | INTRAMUSCULAR | Status: DC | PRN

## 2015-10-21 MED ORDER — SUGAMMADEX SODIUM 500 MG/5ML IV SOLN
INTRAVENOUS | Status: DC | PRN
  Administered 2015-10-21: 200 mg via INTRAVENOUS

## 2015-10-21 MED ORDER — ROCURONIUM BROMIDE 50 MG/5ML IV SOLN
INTRAVENOUS | Status: AC
  Filled 2015-10-21: qty 1

## 2015-10-21 MED ORDER — TOBRAMYCIN-DEXAMETHASONE 0.3-0.1 % OP OINT
1.0000 "application " | TOPICAL_OINTMENT | Freq: Two times a day (BID) | OPHTHALMIC | Status: DC
  Filled 2015-10-21: qty 3.5

## 2015-10-21 MED ORDER — METHOCARBAMOL 500 MG PO TABS
500.0000 mg | ORAL_TABLET | Freq: Four times a day (QID) | ORAL | Status: DC | PRN
  Administered 2015-10-21 – 2015-10-23 (×8): 500 mg via ORAL
  Filled 2015-10-21 (×9): qty 1

## 2015-10-21 MED ORDER — HYDROMORPHONE HCL 1 MG/ML IJ SOLN
0.2500 mg | INTRAMUSCULAR | Status: DC | PRN
  Administered 2015-10-21 (×4): 0.5 mg via INTRAVENOUS

## 2015-10-21 MED ORDER — MORPHINE SULFATE (PF) 2 MG/ML IV SOLN
0.5000 mg | INTRAVENOUS | Status: DC | PRN
  Administered 2015-10-21 (×2): 0.5 mg via INTRAVENOUS
  Filled 2015-10-21 (×2): qty 1

## 2015-10-21 MED ORDER — LIDOCAINE 2% (20 MG/ML) 5 ML SYRINGE
INTRAMUSCULAR | Status: AC
  Filled 2015-10-21: qty 5

## 2015-10-21 MED ORDER — HYDROMORPHONE HCL 1 MG/ML IJ SOLN
1.0000 mg | INTRAMUSCULAR | Status: DC | PRN
  Administered 2015-10-21 (×6): 1 mg via INTRAVENOUS
  Administered 2015-10-22 (×7): 2 mg via INTRAVENOUS
  Filled 2015-10-21: qty 1
  Filled 2015-10-21 (×2): qty 2
  Filled 2015-10-21: qty 1
  Filled 2015-10-21 (×2): qty 2
  Filled 2015-10-21 (×2): qty 1
  Filled 2015-10-21 (×2): qty 2
  Filled 2015-10-21: qty 1
  Filled 2015-10-21 (×2): qty 2
  Filled 2015-10-21: qty 1

## 2015-10-21 MED ORDER — LIDOCAINE-EPINEPHRINE 1 %-1:100000 IJ SOLN
10.0000 mL | Freq: Once | INTRAMUSCULAR | Status: AC
  Administered 2015-10-21: 10 mL via INTRADERMAL
  Filled 2015-10-21: qty 1

## 2015-10-21 MED ORDER — FERROUS SULFATE 325 (65 FE) MG PO TABS
325.0000 mg | ORAL_TABLET | Freq: Three times a day (TID) | ORAL | Status: DC
  Administered 2015-10-21 – 2015-10-23 (×8): 325 mg via ORAL
  Filled 2015-10-21 (×8): qty 1

## 2015-10-21 MED ORDER — SODIUM CHLORIDE 0.9 % IV SOLN
INTRAVENOUS | Status: DC
  Administered 2015-10-21: 06:00:00 via INTRAVENOUS

## 2015-10-21 MED ORDER — ACETAMINOPHEN 650 MG RE SUPP: 650.0000 mg | Freq: Four times a day (QID) | RECTAL | Status: DC | PRN

## 2015-10-21 MED ORDER — SODIUM CHLORIDE 0.9 % IJ SOLN
INTRAMUSCULAR | Status: AC
  Filled 2015-10-21: qty 10

## 2015-10-21 MED ORDER — OXYCODONE-ACETAMINOPHEN 5-325 MG PO TABS: 1.0000 | ORAL_TABLET | ORAL | Status: DC | PRN

## 2015-10-21 MED ORDER — SODIUM CHLORIDE 0.9 % IV SOLN
INTRAVENOUS | Status: DC
  Administered 2015-10-21 – 2015-10-23 (×4): via INTRAVENOUS

## 2015-10-21 MED ORDER — CEFAZOLIN SODIUM-DEXTROSE 2-4 GM/100ML-% IV SOLN
2.0000 g | Freq: Three times a day (TID) | INTRAVENOUS | Status: DC
  Administered 2015-10-21: 2 g via INTRAVENOUS
  Filled 2015-10-21 (×2): qty 100

## 2015-10-21 MED ORDER — SUCCINYLCHOLINE CHLORIDE 200 MG/10ML IV SOSY
PREFILLED_SYRINGE | INTRAVENOUS | Status: AC
  Filled 2015-10-21: qty 10

## 2015-10-21 MED ORDER — ONDANSETRON HCL 4 MG/2ML IJ SOLN
INTRAMUSCULAR | Status: AC
  Filled 2015-10-21: qty 2

## 2015-10-21 MED ORDER — METHOCARBAMOL 1000 MG/10ML IJ SOLN: 500.0000 mg | Freq: Four times a day (QID) | INTRAVENOUS | Status: DC | PRN

## 2015-10-21 MED ORDER — MUPIROCIN 2 % EX OINT
1.0000 "application " | TOPICAL_OINTMENT | Freq: Two times a day (BID) | CUTANEOUS | Status: DC
  Administered 2015-10-21 – 2015-10-23 (×4): 1 via NASAL
  Filled 2015-10-21: qty 22

## 2015-10-21 MED ORDER — HYDROMORPHONE HCL 1 MG/ML IJ SOLN
1.0000 mg | Freq: Once | INTRAMUSCULAR | Status: AC
  Administered 2015-10-21: 1 mg via INTRAVENOUS
  Filled 2015-10-21: qty 1

## 2015-10-21 MED ORDER — ENOXAPARIN SODIUM 40 MG/0.4ML ~~LOC~~ SOLN
40.0000 mg | SUBCUTANEOUS | Status: DC
  Administered 2015-10-22 – 2015-10-23 (×2): 40 mg via SUBCUTANEOUS
  Filled 2015-10-21 (×2): qty 0.4

## 2015-10-21 MED ORDER — ONDANSETRON HCL 4 MG/2ML IJ SOLN
INTRAMUSCULAR | Status: DC | PRN
  Administered 2015-10-21: 4 mg via INTRAVENOUS

## 2015-10-21 MED ORDER — SUGAMMADEX SODIUM 200 MG/2ML IV SOLN
INTRAVENOUS | Status: AC
  Filled 2015-10-21: qty 2

## 2015-10-21 MED ORDER — METOCLOPRAMIDE HCL 5 MG/ML IJ SOLN: 5.0000 mg | Freq: Three times a day (TID) | INTRAMUSCULAR | Status: DC | PRN

## 2015-10-21 MED ORDER — MIDAZOLAM HCL 2 MG/2ML IJ SOLN
INTRAMUSCULAR | Status: AC
  Filled 2015-10-21: qty 2

## 2015-10-21 MED ORDER — PROPOFOL 10 MG/ML IV BOLUS
INTRAVENOUS | Status: DC | PRN
  Administered 2015-10-21: 150 mg via INTRAVENOUS

## 2015-10-21 MED ORDER — METOCLOPRAMIDE HCL 5 MG PO TABS: 5.0000 mg | ORAL_TABLET | Freq: Three times a day (TID) | ORAL | Status: DC | PRN

## 2015-10-21 MED ORDER — SUCCINYLCHOLINE CHLORIDE 200 MG/10ML IV SOSY
PREFILLED_SYRINGE | INTRAVENOUS | Status: DC | PRN
  Administered 2015-10-21: 120 mg via INTRAVENOUS

## 2015-10-21 MED ORDER — CHLORHEXIDINE GLUCONATE CLOTH 2 % EX PADS
6.0000 | MEDICATED_PAD | Freq: Every day | CUTANEOUS | Status: DC
  Administered 2015-10-23: 6 via TOPICAL

## 2015-10-21 MED ORDER — DEXAMETHASONE SODIUM PHOSPHATE 10 MG/ML IJ SOLN
INTRAMUSCULAR | Status: AC
  Filled 2015-10-21: qty 1

## 2015-10-21 SURGICAL SUPPLY — 70 items
BANDAGE ACE 6X5 VEL STRL LF (GAUZE/BANDAGES/DRESSINGS) ×4 IMPLANT
BANDAGE ELASTIC 4 VELCRO ST LF (GAUZE/BANDAGES/DRESSINGS) ×1 IMPLANT
BANDAGE ELASTIC 6 VELCRO ST LF (GAUZE/BANDAGES/DRESSINGS) ×5 IMPLANT
BANDAGE ESMARK 6X9 LF (GAUZE/BANDAGES/DRESSINGS) IMPLANT
BIT DRILL 3.8X6 NS (BIT) ×2 IMPLANT
BIT DRILL 4.4 NS (BIT) ×2 IMPLANT
BLADE SURG 15 STRL LF DISP TIS (BLADE) ×1 IMPLANT
BLADE SURG 15 STRL SS (BLADE) ×3
BLADE SURG ROTATE 9660 (MISCELLANEOUS) ×2 IMPLANT
BNDG CMPR 9X6 STRL LF SNTH (GAUZE/BANDAGES/DRESSINGS) ×1
BNDG COHESIVE 6X5 TAN STRL LF (GAUZE/BANDAGES/DRESSINGS) ×1 IMPLANT
BNDG ESMARK 6X9 LF (GAUZE/BANDAGES/DRESSINGS) ×3
BNDG GAUZE ELAST 4 BULKY (GAUZE/BANDAGES/DRESSINGS) ×5 IMPLANT
COVER SURGICAL LIGHT HANDLE (MISCELLANEOUS) ×6 IMPLANT
CUFF TOURNIQUET SINGLE 34IN LL (TOURNIQUET CUFF) ×2 IMPLANT
CUFF TOURNIQUET SINGLE 44IN (TOURNIQUET CUFF) IMPLANT
DRAPE C-ARM 42X72 X-RAY (DRAPES) ×3 IMPLANT
DRAPE C-ARMOR (DRAPES) ×2 IMPLANT
DRAPE EXTREMITY T 121X128X90 (DRAPE) ×3 IMPLANT
DRAPE IMP U-DRAPE 54X76 (DRAPES) ×4 IMPLANT
DRAPE ORTHO SPLIT 77X108 STRL (DRAPES) ×6
DRAPE PROXIMA HALF (DRAPES) ×2 IMPLANT
DRAPE SURG ORHT 6 SPLT 77X108 (DRAPES) ×1 IMPLANT
DRAPE U-SHAPE 47X51 STRL (DRAPES) ×3 IMPLANT
DRSG ADAPTIC 3X8 NADH LF (GAUZE/BANDAGES/DRESSINGS) ×2 IMPLANT
DURAPREP 26ML APPLICATOR (WOUND CARE) ×1 IMPLANT
ELECT REM PT RETURN 9FT ADLT (ELECTROSURGICAL) ×3
ELECTRODE REM PT RTRN 9FT ADLT (ELECTROSURGICAL) ×1 IMPLANT
FACESHIELD WRAPAROUND (MASK) IMPLANT
FACESHIELD WRAPAROUND OR TEAM (MASK) IMPLANT
GAUZE SPONGE 4X4 12PLY STRL (GAUZE/BANDAGES/DRESSINGS) ×8 IMPLANT
GAUZE XEROFORM 5X9 LF (GAUZE/BANDAGES/DRESSINGS) ×1 IMPLANT
GLOVE BIOGEL PI ORTHO PRO 7.5 (GLOVE) ×2
GLOVE BIOGEL PI ORTHO PRO SZ8 (GLOVE) ×2
GLOVE ORTHO TXT STRL SZ7.5 (GLOVE) ×3 IMPLANT
GLOVE PI ORTHO PRO STRL 7.5 (GLOVE) ×1 IMPLANT
GLOVE PI ORTHO PRO STRL SZ8 (GLOVE) ×1 IMPLANT
GLOVE SURG ORTHO 8.5 STRL (GLOVE) ×3 IMPLANT
GOWN STRL REUS W/ TWL LRG LVL3 (GOWN DISPOSABLE) ×2 IMPLANT
GOWN STRL REUS W/ TWL XL LVL3 (GOWN DISPOSABLE) ×2 IMPLANT
GOWN STRL REUS W/TWL LRG LVL3 (GOWN DISPOSABLE) ×6
GOWN STRL REUS W/TWL XL LVL3 (GOWN DISPOSABLE) ×6
GUIDEPIN 3.2X17.5 THRD DISP (PIN) ×2 IMPLANT
GUIDEWIRE BALL NOSE 80CM (WIRE) ×2 IMPLANT
KIT BASIN OR (CUSTOM PROCEDURE TRAY) ×3 IMPLANT
KIT ROOM TURNOVER OR (KITS) ×3 IMPLANT
MANIFOLD NEPTUNE II (INSTRUMENTS) ×3 IMPLANT
NAIL TIBIAL 11MMX34.5CM (Nail) ×2 IMPLANT
NS IRRIG 1000ML POUR BTL (IV SOLUTION) ×3 IMPLANT
PACK GENERAL/GYN (CUSTOM PROCEDURE TRAY) ×3 IMPLANT
PACK UNIVERSAL I (CUSTOM PROCEDURE TRAY) ×3 IMPLANT
PAD ABD 8X10 STRL (GAUZE/BANDAGES/DRESSINGS) ×2 IMPLANT
PAD ARMBOARD 7.5X6 YLW CONV (MISCELLANEOUS) ×6 IMPLANT
SCREW ACECAP 46MM (Screw) ×2 IMPLANT
SCREW CORT BONE 4.5X38 1402238 (Screw) ×2 IMPLANT
SCREW PROXIMAL DEPUY (Screw) ×3 IMPLANT
SCREW PRXML FT 40X5.5XNS LF (Screw) IMPLANT
SPONGE GAUZE 4X4 12PLY STER LF (GAUZE/BANDAGES/DRESSINGS) ×2 IMPLANT
STAPLER SKIN 35 REG (STAPLE) ×2 IMPLANT
STAPLER VISISTAT 35W (STAPLE) ×3 IMPLANT
STOCKINETTE IMPERVIOUS LG (DRAPES) ×1 IMPLANT
SUT ETHILON 2 0 PSLX (SUTURE) ×2 IMPLANT
SUT VIC AB 0 CT1 27 (SUTURE) ×3
SUT VIC AB 0 CT1 27XBRD ANBCTR (SUTURE) ×1 IMPLANT
SUT VIC AB 2-0 CT1 27 (SUTURE) ×3
SUT VIC AB 2-0 CT1 TAPERPNT 27 (SUTURE) ×1 IMPLANT
TOWEL OR 17X24 6PK STRL BLUE (TOWEL DISPOSABLE) ×3 IMPLANT
TOWEL OR 17X26 10 PK STRL BLUE (TOWEL DISPOSABLE) ×3 IMPLANT
TRAY FOLEY CATH 16FRSI W/METER (SET/KITS/TRAYS/PACK) ×2 IMPLANT
WATER STERILE IRR 1000ML POUR (IV SOLUTION) ×3 IMPLANT
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
## 2015-10-21 NOTE — Brief Op Note (Signed)
10/20/2015 - 10/21/2015  5:22 AM  PATIENT:  Keith HazelJoseph Irwin  40 y.o. male  PRE-OPERATIVE DIAGNOSIS:  Left Open Tibial/Fibula Fracture  POST-OPERATIVE DIAGNOSIS:  Left Open Tibial/Fibula Fracture  PROCEDURE:  Procedure(s): INTRAMEDULLARY (IM) NAIL TIBIAL (Left) IRRIGATION AND DEBRIDEMENT OPEN WOUND LEFT LOWER LEG (Left) Biomet Nail  SURGEON:  Surgeon(s) and Role:    * Beverely LowSteve Lanaysia Fritchman, MD - Primary  PHYSICIAN ASSISTANT:   ASSISTANTS: Thea Gisthomas B Dixon, PA-C   ANESTHESIA:   general  EBL:  Total I/O In: 3500 [I.V.:3500] Out: 1050 [Urine:1000; Blood:50]  BLOOD ADMINISTERED:none  DRAINS: none   LOCAL MEDICATIONS USED:  NONE  SPECIMEN:  No Specimen  DISPOSITION OF SPECIMEN:  N/A  COUNTS:  YES  TOURNIQUET:    DICTATION: .Other Dictation: Dictation Number [id number]  PLAN OF CARE: Admit to inpatient   PATIENT DISPOSITION:  PACU - hemodynamically stable.   Delay start of Pharmacological VTE agent (>24hrs) due to surgical blood loss or risk of bleeding: no

## 2015-10-21 NOTE — Anesthesia Procedure Notes (Signed)
Procedure Name: Intubation Date/Time: 10/21/2015 3:29 AM Performed by: Melina SchoolsBANKS, Aviah Sorci J Pre-anesthesia Checklist: Patient identified, Emergency Drugs available, Suction available and Patient being monitored Patient Re-evaluated:Patient Re-evaluated prior to inductionOxygen Delivery Method: Circle system utilized Preoxygenation: Pre-oxygenation with 100% oxygen Intubation Type: IV induction, Rapid sequence and Cricoid Pressure applied Laryngoscope Size: Mac and 3 Grade View: Grade II Tube type: Oral Tube size: 8.0 mm Number of attempts: 1 Airway Equipment and Method: Stylet Placement Confirmation: ETT inserted through vocal cords under direct vision,  positive ETCO2 and breath sounds checked- equal and bilateral Secured at: 24 cm Tube secured with: Tape Dental Injury: Teeth and Oropharynx as per pre-operative assessment

## 2015-10-21 NOTE — ED Notes (Signed)
Portable Xray in process.

## 2015-10-21 NOTE — ED Notes (Signed)
The pt went to c-t °

## 2015-10-21 NOTE — Progress Notes (Signed)
Orthopedic Tech Progress Note Patient Details:  Keith Irwin 02/03/1976 [id number]  Ortho Devices Type of Ortho Device: Post (short leg) splint, Stirrup splint Ortho Device/Splint Location: trapeze bar patient helper Ortho Device/Splint Interventions: Application   Keith Irwin 10/21/2015, 8:43 AM

## 2015-10-21 NOTE — Discharge Instructions (Signed)
You may place NO WEIGHT on the operative left leg.  Keep the left leg elevated to heart level  Keep the left leg clean and dry.  Do not get it wet in the shower.  Change the bandage each day or as much as needed so the dressing does not get saturated.  Follow up with Dr Ranell PatrickNorris in the office in one week for a wound check, call for appointment  [phone]
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
## 2015-10-21 NOTE — ED Notes (Signed)
Pt crying with pain with reduction  Dr Ranell Patricknorris coming to take to or tonight
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
## 2015-10-21 NOTE — Progress Notes (Signed)
Orthopedic Tech Progress Note Patient Details:  Keith Irwin 11/09/1975 [id number]  Ortho Devices Type of Ortho Device: Post (short leg) splint, Stirrup splint Ortho Device/Splint Location: lle Ortho Device/Splint Interventions: Ordered, Application Dr assisted with application of posterior and stirrup splint to lle.  Trinna PostMartinez, Ismeal Heider J 10/21/2015, 2:19 AM

## 2015-10-21 NOTE — ED Notes (Signed)
Multiple portable xrays complete..  Pain med given

## 2015-10-21 NOTE — Transfer of Care (Signed)
Immediate Anesthesia Transfer of Care Note  Patient: Keith Irwin  Procedure(s) Performed: Procedure(s): INTRAMEDULLARY (IM) NAIL TIBIAL (Left) IRRIGATION AND DEBRIDEMENT OPEN WOUND LEFT LOWER LEG (Left)  Patient Location: PACU  Anesthesia Type:General  Level of Consciousness: sedated, patient cooperative and responds to stimulation  Airway & Oxygen Therapy: Patient Spontanous Breathing and Patient connected to nasal cannula oxygen  Post-op Assessment: Report given to RN, Post -op Vital signs reviewed and stable and Patient moving all extremities X 4  Post vital signs: Reviewed and stable  Last Vitals:  Filed Vitals:   10/21/15 0215 10/21/15 0215  BP: 128/116 135/85  Pulse: 86 84  Temp:    Resp:  22    Last Pain:  Filed Vitals:   10/21/15 0228  PainSc: 10-Worst pain ever         Complications: No apparent anesthesia complications
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
## 2015-10-21 NOTE — ED Notes (Signed)
Dr Mora Bellmanoni reducing the fracture of hte pts mid-shaft tib fib with cast applied by ortho tech
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
## 2015-10-22 ENCOUNTER — Encounter (HOSPITAL_COMMUNITY): Payer: Self-pay | Admitting: Orthopedic Surgery

## 2015-10-22 MED ORDER — IOPAMIDOL (ISOVUE-300) INJECTION 61%
100.0000 mL | Freq: Once | INTRAVENOUS | Status: AC | PRN
  Administered 2015-10-22: 100 mL via INTRAVENOUS
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
## 2015-10-22 NOTE — Progress Notes (Signed)
Paged orthopedics on call for Dr Norris's office about patient elevated temperature and worsening stuttering

## 2015-10-22 NOTE — Progress Notes (Signed)
Orthopedics Progress Note  Subjective: i did well with therapy  Objective:  Filed Vitals:   10/22/15 0037 10/22/15 0442  BP: 117/78 133/84  Pulse: 76 90  Temp: 99.3 F (37.4 C) 98.2 F (36.8 C)  Resp: 18 19    General: Awake and alert  Musculoskeletal: dressing changed, wounds benign, compartments supple Neurovascularly intact  Lab Results  Component Value Date   WBC 16.7* 10/21/2015   HGB 12.3* 10/21/2015   HCT 37.1* 10/21/2015   MCV 87.7 10/21/2015   PLT 252 10/21/2015       Component Value Date/Time   NA 141 10/21/2015 0013   K 4.0 10/21/2015 0013   CL 104 10/21/2015 0013   CO2 22 10/20/2015 2358   GLUCOSE 94 10/21/2015 0013   BUN 18 10/21/2015 0013   CREATININE 0.75 10/21/2015 0940   CALCIUM 8.9 10/20/2015 2358   GFRNONAA >60 10/21/2015 0940   GFRAA >60 10/21/2015 0940    Lab Results  Component Value Date   INR 1.03 04/01/2014    Assessment/Plan: POD #1 s/p Procedure(s): INTRAMEDULLARY (IM) NAIL TIBIAL IRRIGATION AND DEBRIDEMENT OPEN WOUND LEFT LOWER LEG Stable following surgery. Continue mobilization DVT prophylaxis D/C planning per therapy, home with HHPT vs short term rehab Counseled on smoking sessation  Almedia BallsSteven R. Ranell PatrickNorris, MD 10/22/2015 7:56 AM

## 2015-10-22 NOTE — Progress Notes (Signed)
Spoke to [employer] Energy[employer]ryson Stilwell [employer]-C about patients stuttering and temperature, he stated monitor patient for worsening symptoms and change in vitals, will continue to monitor

## 2015-10-22 NOTE — Anesthesia Postprocedure Evaluation (Signed)
Anesthesia Post Note  Patient: Keith Irwin  Procedure(s) Performed: Procedure(s) (LRB): INTRAMEDULLARY (IM) NAIL TIBIAL (Left) IRRIGATION AND DEBRIDEMENT OPEN WOUND LEFT LOWER LEG (Left)  Patient location during evaluation: PACU Anesthesia Type: General Level of consciousness: awake and alert Pain management: pain level controlled Vital Signs Assessment: post-procedure vital signs reviewed and stable Respiratory status: spontaneous breathing, nonlabored ventilation, respiratory function stable and patient connected to nasal cannula oxygen Cardiovascular status: blood pressure returned to baseline and stable Postop Assessment: no signs of nausea or vomiting Anesthetic complications: no    Last Vitals:  Filed Vitals:   10/22/15 0037 10/22/15 0442  BP: 117/78 133/84  Pulse: 76 90  Temp: 37.4 C 36.8 C  Resp: 18 19    Last Pain:  Filed Vitals:   10/22/15 0643  PainSc: 5                  Cecile HearingStephen Edward Turk

## 2015-10-22 NOTE — Progress Notes (Signed)
   10/22/15 1420  Clinical Encounter Type  Visited With Patient;Family  Visit Type Initial  Spiritual Encounters  Spiritual Needs Emotional;Prayer  Chaplain on afternoon rounds visited with patient.  Patient alert. Patient mentioned being in some pain.  Chaplain prayed with patient and his mother.  Chaplain made further support available if needed.

## 2015-10-23 MED ORDER — LORAZEPAM 1 MG PO TABS
1.0000 mg | ORAL_TABLET | Freq: Once | ORAL | Status: AC
  Administered 2015-10-23: 1 mg via ORAL
  Filled 2015-10-23: qty 1

## 2015-10-23 NOTE — Progress Notes (Signed)
Spoke to SCANA CorporationBrad Dixon Pa-C about patients stuttering, new orders received

## 2015-10-23 NOTE — Progress Notes (Signed)
Patient discharged to home, discharge instructions given, patient stated he understood, gave prescriptions to patient, equipment delivered to bedside, Spoke to Lehman BrothersBrad Dixon PA-C to clarify eye ointment order, he stated the patient does not need that, he wasn't getting it here, also informed him that patients stuttering is getting better
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
## 2015-10-23 NOTE — Progress Notes (Signed)
   Subjective: 2 Days Post-Op Procedure(s) (LRB): INTRAMEDULLARY (IM) NAIL TIBIAL (Left) IRRIGATION AND DEBRIDEMENT OPEN WOUND LEFT LOWER LEG (Left)  Pt having some issues stuttering this morning and frustrated with his pain level Worked well with therapy yesterday but having pain with dressing change  Elevated temp last night Patient reports pain as moderate.  Objective:   VITALS:   Filed Vitals:   10/22/15 2035 10/23/15 0451  BP: 138/75 137/81  Pulse: 88 85  Temp: 99.7 F (37.6 C) 98.6 F (37 C)  Resp: 19 18    Left leg incisions healing well Moderate ecchymosis distally Mild edema distally nv intact distally Minimal drainage from open fracture site, no drainage from incisions  LABS  Recent Labs  10/20/15 2358 10/21/15 0013 10/21/15 0940  HGB 15.1 15.6 12.3*  HCT 44.9 46.0 37.1*  WBC 14.4*  --  16.7*  PLT 301  --  252     Recent Labs  10/20/15 2358 10/21/15 0013 10/21/15 0940  NA 140 141  --   K 4.1 4.0  --   BUN 15 18  --   CREATININE 0.79 0.90 0.75  GLUCOSE 96 94  --      Assessment/Plan: 2 Days Post-Op Procedure(s) (LRB): INTRAMEDULLARY (IM) NAIL TIBIAL (Left) IRRIGATION AND DEBRIDEMENT OPEN WOUND LEFT LOWER LEG (Left) Continue to work with therapy May send dressing supplies home with him and also offered for him to come to the office for a dressing change but he states he does not have a ride Pain management will be an issue F/u in 1-2 weeks in the office D/c today after therapy     Alphonsa OverallBrad Chauna Osoria, MPAS, PA-C  10/23/2015, 8:07 AM
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
## 2015-10-23 NOTE — Care Management Note (Signed)
Case Management Note  Patient Details  Name: Keith Irwin MRN: [id number] Date of Birth: 06/01/1975  Subjective/Objective:     40 yr old male s/p IM nailing of tibial/fibular fracture               Action/Plan:  DME ordered for patient. No HH needs.    Expected Discharge Date:    10/23/15              Expected Discharge Plan:   Home self Care  In-House Referral:     Discharge planning Services     Post Acute Care Choice:  Durable Medical Equipment Choice offered to:  NA  DME Arranged:  3-N-1, Walker rolling DME Agency:  [facility]  HH Arranged:  NA HH Agency:  NA  Status of Service:  Completed, signed off  If discussed at Long Length of Stay Meetings, dates discussed:    Additional Comments:  Keith Irwin, Keith Rosko [name], RN 10/23/2015, 1:20 PM

## 2016-04-07 NOTE — H&P (Signed)
  Keith Irwin is an 41 y.o. male.    Chief Complaint: left knee pain  HPI: Pt is a 41 y.o. male complaining of left knee pain for multiple months. Pain had continually increased since the beginning. X-rays in the clinic minimal healing s/p IM nail for left tibia fracture. Pt has tried various conservative treatments which have failed to alleviate their symptoms. Various options are discussed with the patient. Risks, benefits and expectations were discussed with the patient. Patient understand the risks, benefits and expectations and wishes to proceed with surgery.   PCP:  No primary care provider on file.  D/C Plans: Home  PMH: Past Medical History:  Diagnosis Date  . Medical history non-contributory     PSH: Past Surgical History:  Procedure Laterality Date  . I&D EXTREMITY Left 10/21/2015   Procedure: IRRIGATION AND DEBRIDEMENT OPEN WOUND LEFT LOWER LEG;  Surgeon: Beverely LowSteve Norris, MD;  Location: [facility] OR;  Service: Orthopedics;  Laterality: Left;  . TIBIA IM NAIL INSERTION Left 10/21/2015   Procedure: INTRAMEDULLARY (IM) NAIL TIBIAL;  Surgeon: Beverely LowSteve Norris, MD;  Location: [facility] OR;  Service: Orthopedics;  Laterality: Left;    Social History:  reports that he has been smoking Cigarettes.  He has a 16.00 pack-year smoking history. He does not have any smokeless tobacco history on file. He reports that he drinks about 4.2 oz of alcohol per week . He reports that he uses drugs, including Marijuana, about 4 times per week.  Allergies:  No Known Allergies  Medications: No current facility-administered medications for this encounter.    Current Outpatient Prescriptions  Medication Sig Dispense Refill  . methocarbamol (ROBAXIN) 500 MG tablet Take 1 tablet (500 mg total) by mouth 3 (three) times daily as needed. 60 tablet 1  . oxyCODONE-acetaminophen (ROXICET) 5-325 MG tablet Take 1-2 tablets by mouth every 4 (four) hours as needed for severe pain. 60 tablet 0  . tobramycin-dexamethasone  (TOBRADEX) ophthalmic ointment Place 1 application into both eyes 2 (two) times daily. (Patient not taking: Reported on 10/21/2015) 3.5 g 0    No results found for this or any previous visit (from the past 48 hour(s)). No results found.  ROS: Pain with rom of the male extremity  Physical Exam:  Alert and oriented [age] y.o. male in no acute distress Cranial nerves 2-12 intact Cervical spine: full rom with no tenderness, nv intact distally Chest: active breath sounds bilaterally, no wheeze rhonchi or rales Heart: regular rate and rhythm, no murmur Abd: non tender non distended with active bowel sounds Hip is stable with rom  Left lower extremity with well healed incision sites Good rom of left knee and ankle without pain nv intact distally No rashes or edema  Assessment/Plan Assessment: left tibia is malunion of fracture s/p IM nail  Plan: Patient will undergo a left tibia screw removal and dynamization of left tibia by Dr[facility]. Risks benefits and expectations were discussed with the patient. Patient understand risks, benefits and expectations and wishes to proceed.

## 2016-04-20 ENCOUNTER — Encounter (HOSPITAL_COMMUNITY): Payer: Self-pay

## 2016-04-20 ENCOUNTER — Encounter (HOSPITAL_COMMUNITY)
Admission: RE | Admit: 2016-04-20 | Discharge: 2016-04-20 | Disposition: A | Discharge status: Home / Self Care | Payer: Medicaid Other | Source: Ambulatory Visit | Attending: Orthopedic Surgery | Admitting: Orthopedic Surgery

## 2016-04-20 DIAGNOSIS — Z01812 Encounter for preprocedural laboratory examination: Secondary | ICD-10-CM | POA: Diagnosis present

## 2016-04-20 DIAGNOSIS — S82102A Unspecified fracture of upper end of left tibia, initial encounter for closed fracture: Secondary | ICD-10-CM | POA: Insufficient documentation

## 2016-04-20 LAB — CBC
HEMATOCRIT: 46.2 % (ref 39.0–52.0)
HEMOGLOBIN: 15.2 g/dL (ref 13.0–17.0)
MCH: 28.7 pg (ref 26.0–34.0)
MCHC: 32.9 g/dL (ref 30.0–36.0)
MCV: 87.2 fL (ref 78.0–100.0)
Platelets: 288 10*3/uL (ref 150–400)
RBC: 5.3 MIL/uL (ref 4.22–5.81)
RDW: 15.3 % (ref 11.5–15.5)
WBC: 9.5 10*3/uL (ref 4.0–10.5)

## 2016-04-20 NOTE — Progress Notes (Signed)
Pt denies having PCP, cardiologist, or cardiac hx.  No complaints of chest pain, SOB or signs of infection at PAT appointment.   EKG: 10/22/15, forwarded to anesthesia for review.
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
## 2016-04-24 ENCOUNTER — Encounter (HOSPITAL_COMMUNITY): Payer: Self-pay | Admitting: Anesthesiology

## 2016-04-24 ENCOUNTER — Ambulatory Visit (HOSPITAL_COMMUNITY): Payer: Medicaid Other | Admitting: Anesthesiology

## 2016-04-24 ENCOUNTER — Ambulatory Visit (HOSPITAL_COMMUNITY): Payer: Medicaid Other | Admitting: Vascular Surgery

## 2016-04-24 ENCOUNTER — Encounter (HOSPITAL_COMMUNITY)
Admission: RE | Disposition: A | Discharge status: Home / Self Care | Payer: Self-pay | Source: Ambulatory Visit | Attending: Orthopedic Surgery

## 2016-04-24 ENCOUNTER — Ambulatory Visit (HOSPITAL_COMMUNITY)
Admission: RE | Admit: 2016-04-24 | Discharge: 2016-04-24 | Disposition: A | Discharge status: Home / Self Care | Payer: Medicaid Other | Source: Ambulatory Visit | Attending: Orthopedic Surgery | Admitting: Orthopedic Surgery

## 2016-04-24 DIAGNOSIS — M25562 Pain in left knee: Secondary | ICD-10-CM | POA: Diagnosis present

## 2016-04-24 DIAGNOSIS — X58XXXD Exposure to other specified factors, subsequent encounter: Secondary | ICD-10-CM | POA: Diagnosis not present

## 2016-04-24 DIAGNOSIS — F17210 Nicotine dependence, cigarettes, uncomplicated: Secondary | ICD-10-CM | POA: Insufficient documentation

## 2016-04-24 DIAGNOSIS — S82202G Unspecified fracture of shaft of left tibia, subsequent encounter for closed fracture with delayed healing: Secondary | ICD-10-CM | POA: Insufficient documentation

## 2016-04-24 HISTORY — PX: HARDWARE REMOVAL: SHX979

## 2016-04-24 LAB — COMPREHENSIVE METABOLIC PANEL
ALT: 17 U/L (ref 17–63)
AST: 24 U/L (ref 15–41)
Albumin: 4.2 g/dL (ref 3.5–5.0)
Alkaline Phosphatase: 86 U/L (ref 38–126)
Anion gap: 10 (ref 5–15)
BUN: 13 mg/dL (ref 6–20)
CHLORIDE: 108 mmol/L (ref 101–111)
CO2: 23 mmol/L (ref 22–32)
CREATININE: 0.67 mg/dL (ref 0.61–1.24)
Calcium: 9.2 mg/dL (ref 8.9–10.3)
Glucose, Bld: 76 mg/dL (ref 65–99)
Potassium: 4.4 mmol/L (ref 3.5–5.1)
Sodium: 141 mmol/L (ref 135–145)
Total Bilirubin: 0.5 mg/dL (ref 0.3–1.2)
Total Protein: 6.8 g/dL (ref 6.5–8.1)

## 2016-04-24 SURGERY — REMOVAL, HARDWARE
Anesthesia: General | Site: Leg Lower | Laterality: Left

## 2016-04-24 MED ORDER — FENTANYL CITRATE (PF) 100 MCG/2ML IJ SOLN
INTRAMUSCULAR | Status: AC
  Filled 2016-04-24: qty 2

## 2016-04-24 MED ORDER — LIDOCAINE 2% (20 MG/ML) 5 ML SYRINGE
INTRAMUSCULAR | Status: DC | PRN
  Administered 2016-04-24: 100 mg via INTRAVENOUS

## 2016-04-24 MED ORDER — CEFAZOLIN SODIUM-DEXTROSE 2-4 GM/100ML-% IV SOLN
2.0000 g | INTRAVENOUS | Status: AC
  Administered 2016-04-24: 2 g via INTRAVENOUS

## 2016-04-24 MED ORDER — MEPERIDINE HCL 25 MG/ML IJ SOLN: 6.2500 mg | INTRAMUSCULAR | Status: DC | PRN

## 2016-04-24 MED ORDER — 0.9 % SODIUM CHLORIDE (POUR BTL) OPTIME
TOPICAL | Status: DC | PRN
  Administered 2016-04-24: 1000 mL

## 2016-04-24 MED ORDER — MIDAZOLAM HCL 2 MG/2ML IJ SOLN
INTRAMUSCULAR | Status: AC
  Filled 2016-04-24: qty 2

## 2016-04-24 MED ORDER — LIDOCAINE 2% (20 MG/ML) 5 ML SYRINGE
INTRAMUSCULAR | Status: AC
  Filled 2016-04-24: qty 5

## 2016-04-24 MED ORDER — CEFAZOLIN SODIUM 1 G IJ SOLR
INTRAMUSCULAR | Status: AC
  Filled 2016-04-24: qty 20

## 2016-04-24 MED ORDER — LACTATED RINGERS IV SOLN: INTRAVENOUS | Status: DC

## 2016-04-24 MED ORDER — OXYCODONE-ACETAMINOPHEN 5-325 MG PO TABS: 1.0000 | ORAL_TABLET | ORAL | 0 refills | Status: DC | PRN

## 2016-04-24 MED ORDER — PROPOFOL 10 MG/ML IV BOLUS
INTRAVENOUS | Status: DC | PRN
  Administered 2016-04-24: 200 mg via INTRAVENOUS

## 2016-04-24 MED ORDER — BUPIVACAINE HCL (PF) 0.25 % IJ SOLN
INTRAMUSCULAR | Status: AC
  Filled 2016-04-24: qty 30

## 2016-04-24 MED ORDER — KETOROLAC TROMETHAMINE 30 MG/ML IJ SOLN
INTRAMUSCULAR | Status: AC
  Filled 2016-04-24: qty 1

## 2016-04-24 MED ORDER — ONDANSETRON HCL 4 MG/2ML IJ SOLN
INTRAMUSCULAR | Status: AC
  Filled 2016-04-24: qty 2

## 2016-04-24 MED ORDER — LACTATED RINGERS IV SOLN
Freq: Once | INTRAVENOUS | Status: AC
  Administered 2016-04-24: 13:00:00 via INTRAVENOUS

## 2016-04-24 MED ORDER — MIDAZOLAM HCL 5 MG/5ML IJ SOLN
INTRAMUSCULAR | Status: DC | PRN
  Administered 2016-04-24: 2 mg via INTRAVENOUS

## 2016-04-24 MED ORDER — ONDANSETRON HCL 4 MG/2ML IJ SOLN
INTRAMUSCULAR | Status: DC | PRN
  Administered 2016-04-24: 4 mg via INTRAVENOUS

## 2016-04-24 MED ORDER — LACTATED RINGERS IV SOLN
INTRAVENOUS | Status: DC
  Administered 2016-04-24: 13:00:00 via INTRAVENOUS

## 2016-04-24 MED ORDER — KETOROLAC TROMETHAMINE 30 MG/ML IJ SOLN
30.0000 mg | Freq: Once | INTRAMUSCULAR | Status: AC
  Administered 2016-04-24: 30 mg via INTRAVENOUS
  Filled 2016-04-24: qty 1

## 2016-04-24 MED ORDER — FENTANYL CITRATE (PF) 100 MCG/2ML IJ SOLN
25.0000 ug | INTRAMUSCULAR | Status: DC | PRN
  Administered 2016-04-24 (×2): 50 ug via INTRAVENOUS

## 2016-04-24 MED ORDER — METOCLOPRAMIDE HCL 5 MG/ML IJ SOLN: 10.0000 mg | Freq: Once | INTRAMUSCULAR | Status: DC | PRN

## 2016-04-24 MED ORDER — PROPOFOL 10 MG/ML IV BOLUS
INTRAVENOUS | Status: AC
  Filled 2016-04-24: qty 20

## 2016-04-24 MED ORDER — FENTANYL CITRATE (PF) 100 MCG/2ML IJ SOLN
INTRAMUSCULAR | Status: DC | PRN
  Administered 2016-04-24: 50 ug via INTRAVENOUS

## 2016-04-24 MED ORDER — CHLORHEXIDINE GLUCONATE 4 % EX LIQD: 60.0000 mL | Freq: Once | CUTANEOUS | Status: DC

## 2016-04-24 SURGICAL SUPPLY — 54 items
BANDAGE ACE 4X5 VEL STRL LF (GAUZE/BANDAGES/DRESSINGS) IMPLANT
BANDAGE ACE 6X5 VEL STRL LF (GAUZE/BANDAGES/DRESSINGS) IMPLANT
BANDAGE ELASTIC 6 VELCRO ST LF (GAUZE/BANDAGES/DRESSINGS) ×2 IMPLANT
BANDAGE ESMARK 6X9 LF (GAUZE/BANDAGES/DRESSINGS) IMPLANT
BNDG CMPR 9X6 STRL LF SNTH (GAUZE/BANDAGES/DRESSINGS)
BNDG COHESIVE 4X5 TAN STRL (GAUZE/BANDAGES/DRESSINGS) IMPLANT
BNDG ESMARK 6X9 LF (GAUZE/BANDAGES/DRESSINGS)
BNDG GAUZE ELAST 4 BULKY (GAUZE/BANDAGES/DRESSINGS) ×1 IMPLANT
COVER SURGICAL LIGHT HANDLE (MISCELLANEOUS) ×3 IMPLANT
CUFF TOURNIQUET SINGLE 18IN (TOURNIQUET CUFF) ×1 IMPLANT
CUFF TOURNIQUET SINGLE 24IN (TOURNIQUET CUFF) IMPLANT
CUFF TOURNIQUET SINGLE 34IN LL (TOURNIQUET CUFF) ×2 IMPLANT
CUFF TOURNIQUET SINGLE 44IN (TOURNIQUET CUFF) IMPLANT
DRAPE C-ARM 42X72 X-RAY (DRAPES) IMPLANT
DRAPE EXTREMITY T 121X128X90 (DRAPE) ×2 IMPLANT
DRAPE HALF SHEET 40X57 (DRAPES) IMPLANT
DRAPE INCISE IOBAN 66X45 STRL (DRAPES) IMPLANT
DRAPE LAPAROTOMY T 102X78X121 (DRAPES) ×1 IMPLANT
DRAPE ORTHO SPLIT 77X108 STRL (DRAPES) ×6
DRAPE PROXIMA HALF (DRAPES) IMPLANT
DRAPE SURG ORHT 6 SPLT 77X108 (DRAPES) IMPLANT
DRSG EMULSION OIL 3X3 NADH (GAUZE/BANDAGES/DRESSINGS) ×1 IMPLANT
DRSG PAD ABDOMINAL 8X10 ST (GAUZE/BANDAGES/DRESSINGS) ×1 IMPLANT
ELECT REM PT RETURN 9FT ADLT (ELECTROSURGICAL) ×3
ELECTRODE REM PT RTRN 9FT ADLT (ELECTROSURGICAL) ×1 IMPLANT
GAUZE SPONGE 4X4 12PLY STRL (GAUZE/BANDAGES/DRESSINGS) ×3 IMPLANT
GLOVE BIOGEL PI ORTHO PRO 7.5 (GLOVE) ×2
GLOVE BIOGEL PI ORTHO PRO SZ8 (GLOVE) ×2
GLOVE ORTHO TXT STRL SZ7.5 (GLOVE) ×3 IMPLANT
GLOVE PI ORTHO PRO STRL 7.5 (GLOVE) ×1 IMPLANT
GLOVE PI ORTHO PRO STRL SZ8 (GLOVE) ×1 IMPLANT
GLOVE SURG ORTHO 8.5 STRL (GLOVE) ×3 IMPLANT
GOWN STRL REUS W/ TWL LRG LVL3 (GOWN DISPOSABLE) ×1 IMPLANT
GOWN STRL REUS W/ TWL XL LVL3 (GOWN DISPOSABLE) ×3 IMPLANT
GOWN STRL REUS W/TWL LRG LVL3 (GOWN DISPOSABLE) ×3
GOWN STRL REUS W/TWL XL LVL3 (GOWN DISPOSABLE) ×9
KIT BASIN OR (CUSTOM PROCEDURE TRAY) ×3 IMPLANT
KIT ROOM TURNOVER OR (KITS) ×3 IMPLANT
MANIFOLD NEPTUNE II (INSTRUMENTS) ×3 IMPLANT
NEEDLE 22X1 1/2 (OR ONLY) (NEEDLE) ×2 IMPLANT
NS IRRIG 1000ML POUR BTL (IV SOLUTION) ×3 IMPLANT
PACK GENERAL/GYN (CUSTOM PROCEDURE TRAY) ×3 IMPLANT
PAD ARMBOARD 7.5X6 YLW CONV (MISCELLANEOUS) ×4 IMPLANT
PAD CAST 4YDX4 CTTN HI CHSV (CAST SUPPLIES) ×1 IMPLANT
PADDING CAST COTTON 4X4 STRL (CAST SUPPLIES)
STAPLER VISISTAT 35W (STAPLE) ×1 IMPLANT
STOCKINETTE IMPERVIOUS 9X36 MD (GAUZE/BANDAGES/DRESSINGS) IMPLANT
SUT ETHILON 3 0 PS 1 (SUTURE) ×3 IMPLANT
SUT VIC AB 2-0 CT1 27 (SUTURE) ×3
SUT VIC AB 2-0 CT1 TAPERPNT 27 (SUTURE) IMPLANT
SYR CONTROL 10ML LL (SYRINGE) ×2 IMPLANT
TOWEL OR 17X24 6PK STRL BLUE (TOWEL DISPOSABLE) ×3 IMPLANT
TOWEL OR 17X26 10 PK STRL BLUE (TOWEL DISPOSABLE) ×3 IMPLANT
WATER STERILE IRR 1000ML POUR (IV SOLUTION) ×1 IMPLANT

## 2016-04-24 NOTE — Anesthesia Procedure Notes (Signed)
Procedure Name: LMA Insertion Date/Time: 04/24/2016 1:13 PM Performed by: Sharlene DoryWALKER, Chaden Doom E Pre-anesthesia Checklist: Patient identified, Emergency Drugs available, Suction available and Patient being monitored Patient Re-evaluated:Patient Re-evaluated prior to inductionOxygen Delivery Method: Circle system utilized Preoxygenation: Pre-oxygenation with 100% oxygen Intubation Type: IV induction LMA: LMA inserted LMA Size: 4.0 Number of attempts: 1 Placement Confirmation: positive ETCO2 and breath sounds checked- equal and bilateral Tube secured with: Tape Dental Injury: Teeth and Oropharynx as per pre-operative assessment

## 2016-04-24 NOTE — Interval H&P Note (Signed)
History and Physical Interval Note:  04/24/2016 12:53 PM  Keith Irwin  has presented today for surgery, with the diagnosis of Left tibia delayed union  The various methods of treatment have been discussed with the patient and family. After consideration of risks, benefits and other options for treatment, the patient has consented to  Procedure(s): Left tibia dynamyzation-removal of proximal interlock screw (Left) as a surgical intervention .  The patient's history has been reviewed, patient examined, no change in status, stable for surgery.  I have reviewed the patient's chart and labs.  Questions were answered to the patient's satisfaction.     Shivam Mestas,STEVEN R

## 2016-04-24 NOTE — Discharge Instructions (Signed)
Ice to the leg.  Weight bearing as tolerated.  Activity as tolerated.  Keep the incision covered and clean and dry for one week, then ok to get it wet in the shower.  Please do not smoke to allow the leg to heal.  Follow up with Dr Ranell PatrickNorris in the office in two weeks [phone]

## 2016-04-24 NOTE — Anesthesia Preprocedure Evaluation (Addendum)
Anesthesia Evaluation  Patient identified by MRN, date of birth, ID band Patient awake    Reviewed: Allergy & Precautions, NPO status , Patient's Chart, lab work & pertinent test results  Airway Mallampati: I  TM Distance: >3 FB Neck ROM: Full    Dental no notable dental hx. (+) Poor Dentition   Pulmonary Current Smoker,    Pulmonary exam normal breath sounds clear to auscultation       Cardiovascular negative cardio ROS Normal cardiovascular exam Rhythm:Regular Rate:Normal     Neuro/Psych negative neurological ROS  negative psych ROS   GI/Hepatic negative GI ROS, Neg liver ROS,   Endo/Other  negative endocrine ROS  Renal/GU negative Renal ROS  negative genitourinary   Musculoskeletal negative musculoskeletal ROS (+)   Abdominal   Peds negative pediatric ROS (+)  Hematology negative hematology ROS (+)   Anesthesia Other Findings   Reproductive/Obstetrics negative OB ROS                           Anesthesia Physical Anesthesia Plan  ASA: II  Anesthesia Plan: General   Post-op Pain Management:    Induction: Intravenous  Airway Management Planned: LMA  Additional Equipment:   Intra-op Plan:   Post-operative Plan: Extubation in OR  Informed Consent: I have reviewed the patients History and Physical, chart, labs and discussed the procedure including the risks, benefits and alternatives for the proposed anesthesia with the patient or authorized representative who has indicated his/her understanding and acceptance.   Dental advisory given  Plan Discussed with: CRNA  Anesthesia Plan Comments:         Anesthesia Quick Evaluation

## 2016-04-24 NOTE — Progress Notes (Deleted)
This note also relates to the following rows which could not be included: Pulse Rate - Cannot attach notes to unvalidated device data ECG Heart Rate - Cannot attach notes to unvalidated device data Resp - Cannot attach notes to unvalidated device data SpO2 - Cannot attach notes to unvalidated device data   

## 2016-04-24 NOTE — Transfer of Care (Signed)
Immediate Anesthesia Transfer of Care Note  Patient: Keith Irwin  Procedure(s) Performed: Procedure(s): Left tibia dynamyzation-removal of proximal interlock screw (Left)  Patient Location: PACU  Anesthesia Type:General  Level of Consciousness: awake, alert  and oriented  Airway & Oxygen Therapy: Patient Spontanous Breathing and Patient connected to nasal cannula oxygen  Post-op Assessment: Report given to RN, Post -op Vital signs reviewed and stable and Patient moving all extremities  Post vital signs: Reviewed and stable  Last Vitals:  Vitals:   04/24/16 1226 04/24/16 1400  BP: 131/82   Pulse: (!) 58   Resp: 18   Temp: 37 C 36.4 C    Last Pain:  Vitals:   04/24/16 1226  TempSrc: Oral      Patients Stated Pain Goal: 3 (04/24/16 1248)  Complications: No apparent anesthesia complications

## 2016-04-24 NOTE — Brief Op Note (Signed)
04/24/2016  1:41 PM  PATIENT:  Sigmund HazelJoseph Narciso  41 y.o. male  PRE-OPERATIVE DIAGNOSIS:  Left tibia delayed union  POST-OPERATIVE DIAGNOSIS: same  PROCEDURE:  Procedure(s): Left tibia dynamyzation-removal of proximal interlock screw (Left)  SURGEON:  Surgeon(s) and Role:    * Beverely LowSteve Korver Graybeal, MD - Primary  PHYSICIAN ASSISTANT:   ASSISTANTS: none    ANESTHESIA:   general  EBL:  No intake/output data recorded.  BLOOD ADMINISTERED:none  DRAINS: none   LOCAL MEDICATIONS USED:  MARCAINE     SPECIMEN: screw  DISPOSITION OF SPECIMEN:  Path then to patient  COUNTS:  YES  TOURNIQUET:  * No tourniquets in log *  DICTATION: .Other Dictation: Dictation Number [id number]  PLAN OF CARE: Discharge to home after PACU  PATIENT DISPOSITION:  PACU - hemodynamically stable.   Delay start of Pharmacological VTE agent (>24hrs) due to surgical blood loss or risk of bleeding: not applicable

## 2016-04-27 ENCOUNTER — Encounter (HOSPITAL_COMMUNITY): Payer: Self-pay | Admitting: Orthopedic Surgery

## 2016-04-27 NOTE — Op Note (Signed)
NAMSigmund Irwin:  Baldassari, Jazon               ACCOUNT NO.:  [id number]  MEDICAL RECORD NO.:  [id number]  LOCATION:                                 FACILITY:  PHYSICIAN:  Almedia BallsSteven R. Ranell PatrickNorris, M.D. DATE OF BIRTH:  April 21, 1975  DATE OF PROCEDURE:  04/24/2016 DATE OF DISCHARGE:                              OPERATIVE REPORT   PREOPERATIVE DIAGNOSIS:  Left tibia delayed union status post intramedullary nailing.  POSTOPERATIVE DIAGNOSIS:  Left tibia delayed union status post intramedullary nailing.  PROCEDURE PERFORMED:  Left tibial dynamization with removal of proximal interlocking screw.  ATTENDING SURGEON:  Almedia BallsSteven R. Ranell PatrickNorris, MD.  ASSISTANT:  None.  ANESTHESIA:  General anesthesia was used by laryngeal mask.  ESTIMATED BLOOD LOSS:  Minimal.  FLUID REPLACEMENT:  200 mL crystalloid.  INSTRUMENT COUNTS:  Correct.  COMPLICATIONS:  None.  ANTIBIOTICS:  Perioperative antibiotics were given.  INDICATIONS:  The patient is a 41 year old male with a history of an open tibia fracture requiring IM nailing.  The patient underwent successful surgery and now he presents with a delayed union.  The patient is a smoker.  X-rays demonstrating hypertrophic callus formation around the fracture site.  Discussed options for management of the patient includes exchange nailing, continue conservative treatment versus dynamization.  We did discuss that dynamization would provide immediate mobilization, weightbearing on the nail, and that hopefully this would result in some healing as well as a bone stimulator.  Risks and benefits of surgery were discussed in detail with the patient. Informed consent obtained.  DESCRIPTION OF PROCEDURE:  After an adequate level of anesthesia was achieved, the patient was positioned supine on the operating room table. Left leg preoperatively identified, sterilely prepped and draped in usual manner.  Time-out called.  We made a small skin incision overlying the subcutaneous  palpable proximal tibial interlocking screw.  Dissection down through subcutaneous tissues using hemostat and a Therapist, nutritionalreer elevator.  Identified the screw and removed it using a universal screw removal set.  We irrigated the wound thoroughly and closed with interrupted nylon closure followed by sterile compressive bandage.  The patient tolerated the surgery well.     Almedia BallsSteven R. Ranell PatrickNorris, M.D.   ______________________________ Almedia BallsSteven R. Ranell PatrickNorris, M.D.    SRN/MEDQ  D:  04/24/2016  T:  04/25/2016  Job:  [id number]

## 2016-04-27 NOTE — Anesthesia Postprocedure Evaluation (Signed)
Anesthesia Post Note  Patient: Sigmund HazelJoseph Brener  Procedure(s) Performed: Procedure(s) (LRB): Left tibia dynamyzation-removal of proximal interlock screw (Left)  Patient location during evaluation: PACU Anesthesia Type: General Level of consciousness: awake and alert Pain management: pain level controlled Vital Signs Assessment: post-procedure vital signs reviewed and stable Respiratory status: spontaneous breathing, nonlabored ventilation, respiratory function stable and patient connected to nasal cannula oxygen Cardiovascular status: blood pressure returned to baseline and stable Postop Assessment: no signs of nausea or vomiting Anesthetic complications: no       Last Vitals:  Vitals:   04/24/16 1545 04/24/16 1600  BP:    Pulse: (!) 52 62  Resp: 17 18  Temp:  36.3 C    Last Pain:  Vitals:   04/24/16 1459  TempSrc:   PainSc: 7                  Phillips Groutarignan, Marsheila Alejo

## 2016-09-14 ENCOUNTER — Emergency Department (HOSPITAL_COMMUNITY): Payer: Medicaid Other

## 2016-09-14 ENCOUNTER — Encounter (HOSPITAL_COMMUNITY): Payer: Self-pay | Admitting: Emergency Medicine

## 2016-09-14 ENCOUNTER — Emergency Department (HOSPITAL_COMMUNITY)
Admission: EM | Admit: 2016-09-14 | Discharge: 2016-09-15 | Disposition: A | Discharge status: Home / Self Care | Payer: Medicaid Other | Attending: Emergency Medicine | Admitting: Emergency Medicine

## 2016-09-14 DIAGNOSIS — R079 Chest pain, unspecified: Secondary | ICD-10-CM

## 2016-09-14 DIAGNOSIS — J40 Bronchitis, not specified as acute or chronic: Secondary | ICD-10-CM

## 2016-09-14 DIAGNOSIS — R071 Chest pain on breathing: Secondary | ICD-10-CM | POA: Diagnosis present

## 2016-09-14 DIAGNOSIS — F17210 Nicotine dependence, cigarettes, uncomplicated: Secondary | ICD-10-CM | POA: Diagnosis not present

## 2016-09-14 LAB — CBC
HEMATOCRIT: 40.5 % (ref 39.0–52.0)
HEMOGLOBIN: 13.9 g/dL (ref 13.0–17.0)
MCH: 29.7 pg (ref 26.0–34.0)
MCHC: 34.3 g/dL (ref 30.0–36.0)
MCV: 86.5 fL (ref 78.0–100.0)
Platelets: 237 10*3/uL (ref 150–400)
RBC: 4.68 MIL/uL (ref 4.22–5.81)
RDW: 13.6 % (ref 11.5–15.5)
WBC: 9.1 10*3/uL (ref 4.0–10.5)

## 2016-09-14 LAB — BASIC METABOLIC PANEL
ANION GAP: 9 (ref 5–15)
BUN: 11 mg/dL (ref 6–20)
CALCIUM: 9.1 mg/dL (ref 8.9–10.3)
CHLORIDE: 104 mmol/L (ref 101–111)
CO2: 26 mmol/L (ref 22–32)
Creatinine, Ser: 0.74 mg/dL (ref 0.61–1.24)
GFR calc non Af Amer: >60 mL/min (ref >60)
GLUCOSE: 108 mg/dL — AB (ref 65–99)
POTASSIUM: 3.3 mmol/L — AB (ref 3.5–5.1)
Sodium: 139 mmol/L (ref 135–145)

## 2016-09-14 LAB — I-STAT TROPONIN, ED: Troponin i, poc: 0 ng/mL (ref 0.00–0.08)

## 2016-09-14 MED ORDER — PREDNISONE 20 MG PO TABS
60.0000 mg | ORAL_TABLET | Freq: Once | ORAL | Status: AC
  Administered 2016-09-15: 60 mg via ORAL
  Filled 2016-09-14: qty 3

## 2016-09-14 MED ORDER — ALBUTEROL SULFATE HFA 108 (90 BASE) MCG/ACT IN AERS
2.0000 | INHALATION_SPRAY | Freq: Once | RESPIRATORY_TRACT | Status: AC
  Administered 2016-09-15: 2 via RESPIRATORY_TRACT
  Filled 2016-09-14: qty 6.7

## 2016-09-14 MED ORDER — NAPROXEN 250 MG PO TABS
500.0000 mg | ORAL_TABLET | Freq: Once | ORAL | Status: AC
  Administered 2016-09-15: 500 mg via ORAL
  Filled 2016-09-14: qty 2
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
## 2016-09-14 NOTE — ED Triage Notes (Signed)
Pt presents to ED for assessment of left sided chest pain starting approx 2 days ago, intermittent x 2 days, increasing with cough and deep inspiration.  Current 1 pack per day smoker.  Endorses SOB.  Denies n/v.

## 2016-09-15 MED ORDER — NAPROXEN 500 MG PO TABS: 500.0000 mg | ORAL_TABLET | Freq: Two times a day (BID) | ORAL | 0 refills | Status: DC

## 2016-09-15 MED ORDER — ALBUTEROL SULFATE HFA 108 (90 BASE) MCG/ACT IN AERS: 2.0000 | INHALATION_SPRAY | RESPIRATORY_TRACT | 0 refills | Status: DC | PRN

## 2016-09-15 MED ORDER — PREDNISONE 20 MG PO TABS: 40.0000 mg | ORAL_TABLET | Freq: Every day | ORAL | 0 refills | Status: DC

## 2016-09-15 NOTE — ED Notes (Signed)
99% on RA at rest

## 2016-09-15 NOTE — ED Notes (Signed)
Pt was able to ambulate around the nurses station without any difficulty.  Spo2 99% on RA while ambulating

## 2017-07-24 ENCOUNTER — Other Ambulatory Visit: Payer: Self-pay

## 2017-07-24 ENCOUNTER — Encounter (HOSPITAL_COMMUNITY): Payer: Self-pay | Admitting: Emergency Medicine

## 2017-07-24 ENCOUNTER — Emergency Department (HOSPITAL_COMMUNITY)
Admission: EM | Admit: 2017-07-24 | Discharge: 2017-07-25 | Disposition: A | Discharge status: Other Acute Inpatient Hospital | Payer: Medicaid Other | Attending: Emergency Medicine | Admitting: Emergency Medicine

## 2017-07-24 DIAGNOSIS — R45851 Suicidal ideations: Secondary | ICD-10-CM | POA: Insufficient documentation

## 2017-07-24 DIAGNOSIS — F1220 Cannabis dependence, uncomplicated: Secondary | ICD-10-CM | POA: Insufficient documentation

## 2017-07-24 DIAGNOSIS — Z046 Encounter for general psychiatric examination, requested by authority: Secondary | ICD-10-CM | POA: Insufficient documentation

## 2017-07-24 DIAGNOSIS — F1020 Alcohol dependence, uncomplicated: Secondary | ICD-10-CM | POA: Insufficient documentation

## 2017-07-24 DIAGNOSIS — F17210 Nicotine dependence, cigarettes, uncomplicated: Secondary | ICD-10-CM | POA: Insufficient documentation

## 2017-07-24 DIAGNOSIS — F332 Major depressive disorder, recurrent severe without psychotic features: Secondary | ICD-10-CM | POA: Insufficient documentation
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
## 2017-07-24 NOTE — ED Notes (Signed)
Bed: WTR6 Expected date:  Expected time:  Means of arrival:  Comments: 

## 2017-07-25 ENCOUNTER — Other Ambulatory Visit: Payer: Self-pay

## 2017-07-25 ENCOUNTER — Encounter (HOSPITAL_COMMUNITY): Payer: Self-pay

## 2017-07-25 ENCOUNTER — Inpatient Hospital Stay (HOSPITAL_COMMUNITY)
Admission: AD | Admit: 2017-07-25 | Discharge: 2017-07-27 | DRG: 885 | Disposition: A | Discharge status: Home / Self Care | Payer: Medicaid Other | Attending: Psychiatry | Admitting: Psychiatry

## 2017-07-25 DIAGNOSIS — Z62820 Parent-biological child conflict: Secondary | ICD-10-CM | POA: Diagnosis not present

## 2017-07-25 DIAGNOSIS — F419 Anxiety disorder, unspecified: Secondary | ICD-10-CM | POA: Diagnosis present

## 2017-07-25 DIAGNOSIS — Z811 Family history of alcohol abuse and dependence: Secondary | ICD-10-CM

## 2017-07-25 DIAGNOSIS — F332 Major depressive disorder, recurrent severe without psychotic features: Secondary | ICD-10-CM | POA: Diagnosis present

## 2017-07-25 DIAGNOSIS — G4700 Insomnia, unspecified: Secondary | ICD-10-CM | POA: Diagnosis present

## 2017-07-25 DIAGNOSIS — R45851 Suicidal ideations: Secondary | ICD-10-CM | POA: Diagnosis present

## 2017-07-25 DIAGNOSIS — Z885 Allergy status to narcotic agent status: Secondary | ICD-10-CM | POA: Diagnosis not present

## 2017-07-25 DIAGNOSIS — F1290 Cannabis use, unspecified, uncomplicated: Secondary | ICD-10-CM | POA: Diagnosis not present

## 2017-07-25 DIAGNOSIS — F1024 Alcohol dependence with alcohol-induced mood disorder: Secondary | ICD-10-CM | POA: Diagnosis present

## 2017-07-25 DIAGNOSIS — R450 Nervousness: Secondary | ICD-10-CM

## 2017-07-25 DIAGNOSIS — F1010 Alcohol abuse, uncomplicated: Secondary | ICD-10-CM | POA: Diagnosis not present

## 2017-07-25 DIAGNOSIS — Z818 Family history of other mental and behavioral disorders: Secondary | ICD-10-CM

## 2017-07-25 DIAGNOSIS — F17210 Nicotine dependence, cigarettes, uncomplicated: Secondary | ICD-10-CM | POA: Diagnosis not present

## 2017-07-25 LAB — COMPREHENSIVE METABOLIC PANEL
ALBUMIN: 3.9 g/dL (ref 3.5–5.0)
ALK PHOS: 71 U/L (ref 38–126)
ALT: 18 U/L (ref 17–63)
ANION GAP: 13 (ref 5–15)
AST: 25 U/L (ref 15–41)
BILIRUBIN TOTAL: 0.5 mg/dL (ref 0.3–1.2)
BUN: 14 mg/dL (ref 6–20)
CALCIUM: 9.2 mg/dL (ref 8.9–10.3)
CO2: 23 mmol/L (ref 22–32)
CREATININE: 0.74 mg/dL (ref 0.61–1.24)
Chloride: 105 mmol/L (ref 101–111)
GFR calc Af Amer: >60 mL/min (ref >60)
GFR calc non Af Amer: >60 mL/min (ref >60)
GLUCOSE: 106 mg/dL — AB (ref 65–99)
Potassium: 3.9 mmol/L (ref 3.5–5.1)
SODIUM: 141 mmol/L (ref 135–145)
Total Protein: 6.9 g/dL (ref 6.5–8.1)

## 2017-07-25 LAB — SALICYLATE LEVEL: Salicylate Lvl: <7 mg/dL (ref 2.8–30.0)

## 2017-07-25 LAB — CBC
HEMATOCRIT: 43.8 % (ref 39.0–52.0)
HEMOGLOBIN: 14.6 g/dL (ref 13.0–17.0)
MCH: 29.4 pg (ref 26.0–34.0)
MCHC: 33.3 g/dL (ref 30.0–36.0)
MCV: 88.1 fL (ref 78.0–100.0)
Platelets: 233 10*3/uL (ref 150–400)
RBC: 4.97 MIL/uL (ref 4.22–5.81)
RDW: 13.6 % (ref 11.5–15.5)
WBC: 8.4 10*3/uL (ref 4.0–10.5)

## 2017-07-25 LAB — ETHANOL: Alcohol, Ethyl (B): <10 mg/dL (ref <10)

## 2017-07-25 LAB — ACETAMINOPHEN LEVEL

## 2017-07-25 MED ORDER — SERTRALINE HCL 50 MG PO TABS
50.0000 mg | ORAL_TABLET | Freq: Every day | ORAL | Status: DC
  Administered 2017-07-25 – 2017-07-27 (×3): 50 mg via ORAL
  Filled 2017-07-25 (×6): qty 1

## 2017-07-25 MED ORDER — THIAMINE HCL 100 MG/ML IJ SOLN: 100.0000 mg | Freq: Once | INTRAMUSCULAR | Status: DC

## 2017-07-25 MED ORDER — MAGNESIUM HYDROXIDE 400 MG/5ML PO SUSP: 30.0000 mL | Freq: Every day | ORAL | Status: DC | PRN

## 2017-07-25 MED ORDER — LORAZEPAM 1 MG PO TABS: 1.0000 mg | ORAL_TABLET | Freq: Three times a day (TID) | ORAL | Status: DC

## 2017-07-25 MED ORDER — HYDROXYZINE HCL 25 MG PO TABS: 25.0000 mg | ORAL_TABLET | Freq: Four times a day (QID) | ORAL | Status: DC | PRN

## 2017-07-25 MED ORDER — VITAMIN B-1 100 MG PO TABS
100.0000 mg | ORAL_TABLET | Freq: Every day | ORAL | Status: DC
  Administered 2017-07-26 – 2017-07-27 (×2): 100 mg via ORAL
  Filled 2017-07-25 (×4): qty 1

## 2017-07-25 MED ORDER — HYDROXYZINE HCL 25 MG PO TABS
25.0000 mg | ORAL_TABLET | Freq: Four times a day (QID) | ORAL | Status: DC | PRN
  Administered 2017-07-25 – 2017-07-27 (×3): 25 mg via ORAL
  Filled 2017-07-25 (×3): qty 1

## 2017-07-25 MED ORDER — LORAZEPAM 1 MG PO TABS: 1.0000 mg | ORAL_TABLET | Freq: Two times a day (BID) | ORAL | Status: DC

## 2017-07-25 MED ORDER — CHLORDIAZEPOXIDE HCL 25 MG PO CAPS
25.0000 mg | ORAL_CAPSULE | Freq: Four times a day (QID) | ORAL | Status: DC | PRN
  Administered 2017-07-25 – 2017-07-26 (×3): 25 mg via ORAL
  Filled 2017-07-25 (×3): qty 1

## 2017-07-25 MED ORDER — LOPERAMIDE HCL 2 MG PO CAPS: 2.0000 mg | ORAL_CAPSULE | ORAL | Status: DC | PRN

## 2017-07-25 MED ORDER — ONDANSETRON 4 MG PO TBDP: 4.0000 mg | ORAL_TABLET | Freq: Four times a day (QID) | ORAL | Status: DC | PRN

## 2017-07-25 MED ORDER — ACETAMINOPHEN 325 MG PO TABS: 650.0000 mg | ORAL_TABLET | Freq: Four times a day (QID) | ORAL | Status: DC | PRN

## 2017-07-25 MED ORDER — VITAMIN B-1 100 MG PO TABS
100.0000 mg | ORAL_TABLET | Freq: Every day | ORAL | Status: DC
Start: 2017-07-26 — End: 2017-07-25
  Filled 2017-07-25: qty 1

## 2017-07-25 MED ORDER — NICOTINE 21 MG/24HR TD PT24
21.0000 mg | MEDICATED_PATCH | Freq: Every day | TRANSDERMAL | Status: DC
  Administered 2017-07-25 – 2017-07-27 (×3): 21 mg via TRANSDERMAL
  Filled 2017-07-25 (×5): qty 1

## 2017-07-25 MED ORDER — ALUM & MAG HYDROXIDE-SIMETH 200-200-20 MG/5ML PO SUSP: 30.0000 mL | ORAL | Status: DC | PRN

## 2017-07-25 MED ORDER — ADULT MULTIVITAMIN W/MINERALS CH
1.0000 | ORAL_TABLET | Freq: Every day | ORAL | Status: DC
  Administered 2017-07-25 – 2017-07-27 (×3): 1 via ORAL
  Filled 2017-07-25 (×6): qty 1

## 2017-07-25 MED ORDER — LORAZEPAM 1 MG PO TABS: 1.0000 mg | ORAL_TABLET | Freq: Four times a day (QID) | ORAL | Status: DC

## 2017-07-25 MED ORDER — LORAZEPAM 1 MG PO TABS: 1.0000 mg | ORAL_TABLET | Freq: Every day | ORAL | Status: DC

## 2017-07-25 MED ORDER — TRAZODONE HCL 50 MG PO TABS
50.0000 mg | ORAL_TABLET | Freq: Every evening | ORAL | Status: DC | PRN
  Administered 2017-07-25 – 2017-07-26 (×2): 50 mg via ORAL
  Filled 2017-07-25 (×2): qty 1

## 2017-07-25 MED ORDER — LORAZEPAM 1 MG PO TABS: 1.0000 mg | ORAL_TABLET | Freq: Four times a day (QID) | ORAL | Status: DC | PRN
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
## 2017-07-25 NOTE — ED Notes (Signed)
Patient is irritable at this time. Patient denies SI/HI/AVH at this time. Plan of care discussed. Encouragement and support provided and safety maintain. Q 15 min safety check in place and video monitoring.

## 2017-07-25 NOTE — Progress Notes (Signed)
Keith Irwin is an 42 y.o. single male, NKA, who presents unaccompanied to [facility] via [employer] examiner[employer] enforcement after being petitioned for involuntary commitment by his step-daughter, Richrd PrimeLindsey Haines [phone]. Affidavit and petition states: "Respondent is not sleeping or tending to personal hygiene. Respondent has made statements to family that he wants to commit suicide and has held a loaded gun to his head. Respondent has become aggressive with his son by trying to fight him. Respondent drinks alcohol all day every day, his choice of drink are beers. Respondent also smokes marijuana every day."   Pt says he was brought in by law enforcement because his step-daughter reported he had fought with his son. He reports his son is "hard-headed" and rebellious. Pt acknowledges that he was suicidal yesterday and put a load gun to his head but says he wouldn't pull the trigger. He says the gun is currently with his Pt's father. He denies current suicidal ideation. He denies any history of suicide attempts. He denies any history of intentional self-injurious behavior. Pt acknowledges symptoms including social withdrawal, fatigue and irritability. He says he is sleeping approximately two hours per night. He denies current homicidal ideation or history of violence. He denies any history of psychotic symptoms. He reports using alcohol and marijuana every day and says he drinks "a lot" (see below for details of use). He denies other substance use.  Pt identifies conflicts with his 41 year old son as his primary stressor. Pt reports he lives with his father and his son. Pt has no other children. Pt says he has financial problems because he is only working part-time. He says he has a court date 07/28/17 for DWI and driving without a license. He [facility] for depression and substance use.  Pt is angry and irritated.  Pt refuses to answer most questions.  Pt sts he wants to "get out of here".  Pt  sts if we shut up with the questions he will go to sleep.  Pt makes no eye contact, is poor historian, no-compliant, and refuses to undress for skin check.  Pt has multiple tattoos as noted in skin assessment. Pt denies ETOH withdrawals at this time and refuses nicotine patch despite 1 pack a day smoker.   Pt is in room in bed and sleeping.  Pt remains safe on unit.

## 2017-07-25 NOTE — Progress Notes (Signed)
D.  Pt in bed on approach, attended evening AA group and went to bed immediately after.  Pt requested medication for sleep and anxiety.  Minimal interaction on unit this evening.  Pt denies SI/HI/AVH at this time.  No s/s of withdrawal noted at this time but Librium given since Pt continues to state that he he normally drinks alcohol all day.  A.  Support and encouragement offered, medication given as ordered  R.  Pt remains safe on the unit, will continue to monitor.

## 2017-07-25 NOTE — [facility] Group Notes (Signed)
Pt was invited but did not attend orientation/goals group. 

## 2017-07-25 NOTE — Progress Notes (Signed)
Patient did attend the evening speaker AA meeting.  

## 2017-07-25 NOTE — Tx Team (Addendum)
Initial Treatment Plan 07/25/2017 3:42 AM Keith Irwin ZOX:[id number]    PATIENT STRESSORS: Financial difficulties Marital or family conflict Substance abuse   PATIENT STRENGTHS: Supportive family/friends  Average intelligence   PATIENT IDENTIFIED PROBLEMS: SI "I aint gonna hurt myself"  Substance abuse "I drink whatever I can get and im not stopping"  "Im not answering any more questions"                 DISCHARGE CRITERIA:  Ability to meet basic life and health needs Adequate post-discharge living arrangements Improved stabilization in mood, thinking, and/or behavior Medical problems require only outpatient monitoring Motivation to continue treatment in a less acute level of care Need for constant or close observation no longer present  PRELIMINARY DISCHARGE PLAN: Attend 12-step recovery group Outpatient therapy Return to previous living arrangement  PATIENT/FAMILY INVOLVEMENT: This treatment plan has been presented to and reviewed with the patient, Keith HazelJoseph Irwin.  The patient has been given the opportunity to ask questions and make suggestions.  Sylvan CheeseSteven M Mutasim Tuckey, RN 07/25/2017, 3:42 AM

## 2017-07-25 NOTE — BH Assessment (Addendum)
Tele Assessment Note   Patient Name: Keith Irwin MRN: [id number] Referring Physician: Rochele Raring, DO  Location of Patient: [name], [facility] Location of Provider: Behavioral Health TTS Department  Keith Irwin is an 42 y.o. single male who presents unaccompanied to [name] via [employer] after being petitioned for involuntary commitment by his step-daughter, Keith Irwin [phone]. Affidavit and petition states: "Respondent is not sleeping or tending to personal hygiene. Respondent has made statements to family that he wants to commit suicide and has held a loaded gun to his head. Respondent has become aggressive with his son by trying to fight him. Respondent drinks alcohol all day every day, his choice of drink are beers. Respondent also smokes marijuana every day."   Pt says he was brought in by law enforcement because his step-daughter reported he had fought with his son. He reports his son is "hard-headed" and rebellious. Pt acknowledges that he was suicidal yesterday and put a load gun to his head but says he wouldn't pull the trigger. He says the gun is currently with his Pt's father. He denies current suicidal ideation. He denies any history of suicide attempts. He denies any history of intentional self-injurious behavior. Pt acknowledges symptoms including social withdrawal, fatigue and irritability. He says he is sleeping approximately two hours per night. He denies current homicidal ideation or history of violence. He denies any history of psychotic symptoms. He reports using alcohol and marijuana every day and says he drinks "a lot" (see below for details of use). He denies other substance use.  Pt identifies conflicts with his 54 year old son as his primary stressor. Pt reports he lives with his father and his son. Pt has no other children. Pt says he has financial problems because he is only working part-time. He says he has a court date 07/28/17 for DWI and  driving without a license. Pt denies any history of abuse or trauma. He says he has several family members who are supportive. He also reports an extensive family history of substance use. He denies any current outpatient mental health providers and says he is not taking any psychiatric medication. He was inpatient at [facility] in 2010 for depression and substance use.   Pt is dressed in hospital scrubs, alert and oriented x4. Pt speaks in a clear tone, at moderate volume and normal pace. Motor behavior appears normal. Eye contact is good. Pt's mood is sullen and affect is irritable. Thought process is coherent and relevant. There is no indication Pt is currently responding to internal stimuli or experiencing delusional thought content. Pt says he does not want to be psychiatrically hospitalized and wants to be discharged home.   Diagnosis: F33.2 Major Depressive Disorder, Recurrent, Severe F10.20 Alcohol Use Disorder, Severe F12.20 Cannabis Use Disorder, Severe  Past Medical History:  Past Medical History:  Diagnosis Date  . Medical history non-contributory     Past Surgical History:  Procedure Laterality Date  . HARDWARE REMOVAL Left 04/24/2016   Procedure: Left tibia dynamyzation-removal of proximal interlock screw;  Surgeon: Beverely Low, MD;  Location[facility] OR;  Service: Orthopedics;  Laterality: Left;  . I&D EXTREMITY Left 10/21/2015   Procedure: IRRIGATION AND DEBRIDEMENT OPEN WOUND LEFT LOWER LEG;  Surgeon: Beverely Low, MD;  Location[facility] OR;  Service: Orthopedics;  Laterality: Left;  . TIBIA IM NAIL INSERTION Left 10/21/2015   Procedure: INTRAMEDULLARY (IM) NAIL TIBIAL;  Surgeon: Beverely Low, MD;  Location: [facility] OR;  Service: Orthopedics;  Laterality: Left;  Family History: History reviewed. No pertinent family history.  Social History:  reports that he has been smoking cigarettes.  He has a 16.00 pack-year smoking history. He has never used smokeless tobacco. He reports that he  drinks about 4.2 oz of alcohol per week. He reports that he does not use drugs.  Additional Social History:  Alcohol / Drug Use Pain Medications: Pt denies Prescriptions: Pt denies Over the Counter: Pt denies History of alcohol / drug use?: Yes Longest period of sobriety (when/how long): Unknown Negative Consequences of Use: Legal, Financial, Personal relationships, Work / School Withdrawal Symptoms: (Pt denies withdrawal symptoms.) Substance #1 Name of Substance 1: Alcohol 1 - Age of First Use: Adolescent 1 - Amount (size/oz): Pt drinks at least 12 beers 1 - Frequency: Daily 1 - Duration: Ongoing for years 1 - Last Use / Amount: 07/24/17 Substance #2 Name of Substance 2: Marijuana 2 - Age of First Use: Adolescent 2 - Amount (size/oz): 1-2 joints 2 - Frequency: Daily 2 - Duration: Ongoing 2 - Last Use / Amount: 07/24/17  CIWA: CIWA-Ar BP: 120/87 Pulse Rate: 72 COWS:    Allergies:  Allergies  Allergen Reactions  . Codeine Other (See Comments)    Unsure of reaction, "feels different"     Home Medications:  (Not in a hospital admission)  OB/GYN Status:  No LMP for male patient.  General Assessment Data Location of Assessment: [facility] ED TTS Assessment: In system Is this a Tele or Face-to-Face Assessment?: Tele Assessment Is this an Initial Assessment or a Re-assessment for this encounter?: Initial Assessment Marital status: Single Maiden name: NA Is patient pregnant?: No Pregnancy Status: No Living Arrangements: Parent, Children(Father, son (15)) Can pt return to current living arrangement?: Yes Admission Status: Involuntary Is patient capable of signing voluntary admission?: Yes Referral Source: Self/Family/Friend Insurance type: Medicaid     Crisis Care Plan Living Arrangements: Parent, Children(Father, son (15)) Legal Guardian: Other:(Self) Name of Psychiatrist: None Name of Therapist: None  Education Status Is patient currently in school?: No Is the  patient employed, unemployed or receiving disability?: Employed  Risk to self with the past 6 months Suicidal Ideation: Yes-Currently Present Has patient been a risk to self within the past 6 months prior to admission? : Yes Suicidal Intent: Yes-Currently Present Has patient had any suicidal intent within the past 6 months prior to admission? : Yes Is patient at risk for suicide?: Yes Suicidal Plan?: Yes-Currently Present Has patient had any suicidal plan within the past 6 months prior to admission? : Yes Specify Current Suicidal Plan: Pt reports he put a loaded gun to his head Access to Means: Yes Specify Access to Suicidal Means: Access to firearms What has been your use of drugs/alcohol within the last 12 months?: Pt reports using alcohol and marijauna Previous Attempts/Gestures: No How many times?: 0 Other Self Harm Risks: None Triggers for Past Attempts: Other personal contacts Intentional Self Injurious Behavior: None Family Suicide History: No Recent stressful life event(s): Legal Issues, Financial Problems, Conflict (Comment)(Conflicts with son) Persecutory voices/beliefs?: No Depression: Yes Depression Symptoms: Despondent, Isolating, Loss of interest in usual pleasures, Feeling worthless/self pity, Feeling angry/irritable Substance abuse history and/or treatment for substance abuse?: Yes Suicide prevention information given to non-admitted patients: Yes  Risk to Others within the past 6 months Homicidal Ideation: No Does patient have any lifetime risk of violence toward others beyond the six months prior to admission? : Yes (comment) Thoughts of Harm to Others: No Current Homicidal Intent: No Current Homicidal Plan:  No Access to Homicidal Means: No Identified Victim: None History of harm to others?: No Assessment of Violence: On admission Violent Behavior Description: IVC paperwork says Pt  has tried to fight his son Does patient have access to weapons?: Yes  (Comment)(Firearms at home) Criminal Charges Pending?: Yes Describe Pending Criminal Charges: DWI Does patient have a court date: Yes Court Date: 07/28/17 Is patient on probation?: No  Psychosis Hallucinations: None noted Delusions: None noted  Mental Status Report Appearance/Hygiene: In scrubs Eye Contact: Good Motor Activity: Unremarkable Speech: Logical/coherent Level of Consciousness: Alert Mood: Sullen Affect: Irritable Anxiety Level: Minimal Thought Processes: Coherent, Relevant Judgement: Impaired Orientation: Person, Place, Time, Situation, Appropriate for developmental age Obsessive Compulsive Thoughts/Behaviors: None  Cognitive Functioning Concentration: Fair Memory: Recent Intact, Remote Intact Is patient IDD: No Is patient DD?: No Insight: Fair Impulse Control: Poor Appetite: Good Have you had any weight changes? : No Change Sleep: Decreased Total Hours of Sleep: 2 Vegetative Symptoms: Decreased grooming  ADLScreening [facility] Assessment Services) Patient's cognitive ability adequate to safely complete daily activities?: Yes Patient able to express need for assistance with ADLs?: Yes Independently performs ADLs?: Yes (appropriate for developmental age)  Prior Inpatient Therapy Prior Inpatient Therapy: Yes Prior Therapy Dates: 12/2008 Prior Therapy [facility] Reason for Treatment: MDD, SA  Prior Outpatient Therapy Prior Outpatient Therapy: No Does patient have an ACCT team?: No Does patient have Intensive In-House Services?  : No Does patient have [facility] services? : No Does patient have P4CC services?: No  ADL Screening (condition at time of admission) Patient's cognitive ability adequate to safely complete daily activities?: Yes Is the patient deaf or have difficulty hearing?: No Does the patient have difficulty seeing, even when wearing glasses/contacts?: No Does the patient have difficulty concentrating, remembering, or making  decisions?: No Patient able to express need for assistance with ADLs?: Yes Does the patient have difficulty dressing or bathing?: No Independently performs ADLs?: Yes (appropriate for developmental age) Does the patient have difficulty walking or climbing stairs?: No Weakness of Legs: None Weakness of Arms/Hands: None       Abuse/Neglect Assessment (Assessment to be complete while patient is alone) Abuse/Neglect Assessment Can Be Completed: Yes Physical Abuse: Denies Verbal Abuse: Denies Sexual Abuse: Denies Exploitation of patient/patient's resources: Denies Self-Neglect: Denies     Merchant navy officerAdvance Directives (For Healthcare) Does Patient Have a Medical Advance Directive?: No Would patient like information on creating a medical advance directive?: No - Patient declined          Disposition: Binnie RailJoAnn Glover, [facility] confirmed bed availability. Gave clinical report to Nira ConnJason Berry, NP who said Pt meets criteria for inpatient psychiatric treatment and accepted Pt to the service of Dr. Jola Babinskilary, room 304-1. Notified Dr. Baxter HireKristen Ward and Tyrell Antonioashell McLean, RN of acceptance.  Disposition Initial Assessment Completed for this Encounter: Yes  This service was provided via telemedicine using a 2-way, interactive audio and video technology.  Names of all persons participating in this telemedicine service and their role in this encounter. Name: Sigmund HazelJoseph Ruddy Role: Patient  Name: Shela CommonsFord Lyann Hagstrom Jr, [place] Role: TTS counselor         Harlin RainFord Ellis Patsy BaltimoreWarrick Jr, [facility], [facility], [facility] Triage Specialist [phone]  Pamalee LeydenWarrick Jr, Donnell Wion Ellis 07/25/2017 1:38 AM

## 2017-07-25 NOTE — ED Provider Notes (Signed)
TIME SEEN: 12:52 AM  CHIEF COMPLAINT: IVC  HPI: Patient is a 42 year old male with no known past medical history who presents to the emergency department under IVC.  Patient brought in by the Police Department after IVC paperwork was taken out by his stepdaughter.  Per paperwork, patient reportedly has been endorsing suicidal thoughts and put a loaded gun to his head.  Patient has also been aggressive towards his son.  They state that he is also abusing alcohol and marijuana.  Patient denies this.  He states he has had suicidal thoughts previously but no previous suicide attempts and no suicidal thoughts, homicidal thoughts or hallucinations at this time.  He has no medical complaints.  ROS: See HPI Constitutional: no fever  Eyes: no drainage  ENT: no runny nose   Cardiovascular:  no chest pain  Resp: no SOB  GI: no vomiting GU: no dysuria Integumentary: no rash  Allergy: no hives  Musculoskeletal: no leg swelling  Neurological: no slurred speech ROS otherwise negative  PAST MEDICAL HISTORY/PAST SURGICAL HISTORY:  Past Medical History:  Diagnosis Date  . Medical history non-contributory     MEDICATIONS:  Prior to Admission medications   Medication Sig Start Date End Date Taking? Authorizing Provider  albuterol (PROVENTIL HFA;VENTOLIN HFA) 108 (90 Base) MCG/ACT inhaler Inhale 2 puffs into the lungs every 4 (four) hours as needed for wheezing or shortness of breath. Patient not taking: Reported on 07/24/2017 09/15/16   Horton, Mayer Masker, MD    ALLERGIES:  Allergies  Allergen Reactions  . Codeine Other (See Comments)    Unsure of reaction, "feels different"     SOCIAL HISTORY:  Social History   Tobacco Use  . Smoking status: Current Every Day Smoker    Packs/day: 1.00    Years: 16.00    Pack years: 16.00    Types: Cigarettes  . Smokeless tobacco: Never Used  Substance Use Topics  . Alcohol use: Yes    Alcohol/week: 4.2 oz    Types: 7 Cans of beer per week    Comment:  1 beer per day    FAMILY HISTORY: History reviewed. No pertinent family history.  EXAM: BP 125/89 (BP Location: Left Arm)   Pulse 72   Temp 98 F (36.7 C) (Oral)   Resp 18   Ht 5\' 6"  (1.676 m)   Wt 68 kg (150 lb)   SpO2 97%   BMI 24.21 kg/m  CONSTITUTIONAL: Alert and oriented and responds appropriately to questions. Well-appearing; well-nourished HEAD: Normocephalic EYES: Conjunctivae clear, pupils appear equal, EOMI ENT: normal nose; moist mucous membranes NECK: Supple, no meningismus, no nuchal rigidity, no LAD  CARD: RRR; S1 and S2 appreciated; no murmurs, no clicks, no rubs, no gallops RESP: Normal chest excursion without splinting or tachypnea; breath sounds clear and equal bilaterally; no wheezes, no rhonchi, no rales, no hypoxia or respiratory distress, speaking full sentences ABD/GI: Normal bowel sounds; non-distended; soft, non-tender, no rebound, no guarding, no peritoneal signs, no hepatosplenomegaly BACK:  The back appears normal and is non-tender to palpation, there is no CVA tenderness EXT: Normal ROM in all joints; non-tender to palpation; no edema; normal capillary refill; no cyanosis, no calf tenderness or swelling    SKIN: Normal color for age and race; warm; no rash NEURO: Moves all extremities equally PSYCH: The patient's mood and manner are appropriate. Grooming and personal hygiene are appropriate.  MEDICAL DECISION MAKING: Patient here under IVC.  Family states that patient having suicidal thoughts and put a gun  to his head.  I will uphold his IVC paperwork at this time and allow TTS to evaluate him.  No medical complaints.  Screening labs unremarkable.  He is medically cleared.  ED PROGRESS: 1:30 AM  [facility]  Patient accepted to behavioral health Hospital for inpatient psychiatric treatment.  I have completed his IVC paperwork.    I reviewed all nursing notes, vitals, pertinent previous records, EKGs, lab and urine results, imaging (as  available).    Mataeo Ingwersen, Layla MawKristen N, DO 07/25/17 [phone]

## 2017-07-25 NOTE — [facility] Group Notes (Signed)
[facility] Group Notes: (Clinical Social Work)   07/25/2017      Type of Therapy:  Group Therapy   Participation Level:  Did Not Attend despite MHT prompting   Skyleen Bentley Grossman-Orr, LCSW 07/25/2017, 12:36 PM     

## 2017-07-25 NOTE — ED Notes (Signed)
Patient transported to [facility], by [place] [employer], for continuation of specialized care. Belongings signed for and given to deputy. Pt left in no acute distress. 

## 2017-07-25 NOTE — Progress Notes (Signed)
Patient ID: Keith Irwin, male   DOB: 09/17/1975, 42 y.o.   MRN: [id number]  Nursing Progress Note [id number]  Data: Patient presents with flat affect and depressed mood. Patient is guarded, minimal and forwards little with Clinical research associatewriter. Patient is cooperative and compliant with scheduled medications. Patient did cooperative with EKG. Patient denies concerns for Clinical research associatewriter. Patient states he has "unfortunately" been here before. Patient does not talk about why he is here or what his plan is for discharge. Patient completed self-inventory sheet and rates depression, hopelessness, and anxiety 3,0,4  respectively. Patient rates their sleep and appetite as poor/good respectively. Patient states goal for today is to "manage my temper" and "stay strong in my feelings". Withdrawal symptoms monitored with CIWA score.  Action: Patient educated about and provided medication per provider's orders. Patient safety maintained with q15 min safety checks. Low fall risk precautions in place. Emotional support given. 1:1 interaction and active listening provided. Patient encouraged to attend meals and groups. Labs, vital signs and patient behavior monitored throughout shift. Patient encouraged to work on treatment plan and goals.  Response: Patient remains safe on the unit at this time. Will continue to support and monitor.

## 2017-07-25 NOTE — Plan of Care (Signed)
Problem: Education: Goal: Knowledge of [facility] Education information/materials will improve Intervention: Patient has been educated with written admission paperwork and verbal explanations. Patient educated about unit rules, behavior expectations and BH policies.  Outcome: Patient verbalizes understanding.  07/25/2017 10:18 AM - Progressing by Ferrel Loganollazo, Keriann Rankin A, RN   Problem: Safety: Goal: Periods of time without injury will increase Intervention: Patient contracts for safety on the unit. Low fall risk precautions in place. Safety monitored with q15 minute checks. Outcome: Patient remains safe on the unit at this time. 07/25/2017 10:18 AM - Progressing by Ferrel Loganollazo, Kaisei Gilbo A, RN
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
## 2017-07-25 NOTE — Progress Notes (Signed)
Patient ID: Keith Irwin, male   DOB: 03/19/1976, 42 y.o.   MRN: [id number]  Patient returned from dinner and is observed resting in his bed, tearful. Patient states, "I just don't want to be around people right now". Patient refuses 1800 vitals and states, "can I do it in a bit?" Patient with sad/depressed mood but is in no acute distress. Patient denies withdrawal symptoms at this time. Patient refuses need for PRN medications or to talk with Clinical research associatewriter. Patient contracts for safety on the unit. Will continue to monitor.

## 2017-07-26 LAB — TSH: TSH: 2.675 u[IU]/mL (ref 0.350–4.500)

## 2017-07-26 MED ORDER — ACAMPROSATE CALCIUM 333 MG PO TBEC
666.0000 mg | DELAYED_RELEASE_TABLET | Freq: Three times a day (TID) | ORAL | Status: DC
  Administered 2017-07-26 – 2017-07-27 (×3): 666 mg via ORAL
  Filled 2017-07-26 (×10): qty 2

## 2017-07-26 NOTE — Progress Notes (Signed)
Recreation Therapy Notes  Date: 07/26/17 Time: 0930 Location: 300 Hall Dayroom  Group Topic: Stress Management  Goal Area(s) Addresses:  Patient will verbalize importance of using healthy stress management.  Patient will identify positive emotions associated with healthy stress management.   Behavioral Response: Engaged  Intervention: Stress Management  Activity :  Meditation.  LRT played a meditation from the Calm app that allowed patients to relax and learn how to be resilient in the face of change.  Education:  Stress Management, Discharge Planning.   Education Outcome: Acknowledges edcuation/In group clarification offered/Needs additional education  Clinical Observations/Feedback: Pt attended group.    Caroll RancherMarjette Jerrilyn Messinger, LRT/CTRS         Caroll RancherLindsay, Captain Blucher A 07/26/2017 10:52 AM

## 2017-07-26 NOTE — Progress Notes (Signed)
At pt request, letter was faxed to public defender Wallene DalesGabriel Kussin, [phone], regarding pt not being able to attend court tomorrow.  Letter also faxed to clerk of superior court. Pt signed release. Garner NashGregory Raghad Lorenz, MSW, LCSW Clinical Social Worker 07/26/2017 2:21 PM

## 2017-07-26 NOTE — BHH Group Notes (Signed)
B[facility] LCSW Group Therapy Note  Date/Time:07/26/17, 1315  Type of Therapy and Topic:  Group Therapy:  Overcoming Obstacles  Participation Level:  active  Description of Group:    In this group patients will be encouraged to explore what they see as obstacles to their own wellness and recovery. They will be guided to discuss their thoughts, feelings, and behaviors related to these obstacles. The group will process together ways to cope with barriers, with attention given to specific choices patients can make. Each patient will be challenged to identify changes they are motivated to make in order to overcome their obstacles. This group will be process-oriented, with patients participating in exploration of their own experiences as well as giving and receiving support and challenge from other group members.  Therapeutic Goals: 1. Patient will identify personal and current obstacles as they relate to admission. 2. Patient will identify barriers that currently interfere with their wellness or overcoming obstacles.  3. Patient will identify feelings, thought process and behaviors related to these barriers. 4. Patient will identify two changes they are willing to make to overcome these obstacles:    Summary of Patient Progress: Pt shared that family, finances, and alcohol are current obstacles in his life.  Pt made several contributions to the group discussion about positive ways to overcome obstacles.     Therapeutic Modalities:   Cognitive Behavioral Therapy Solution Focused Therapy Motivational Interviewing Relapse Prevention Therapy  Daleen SquibbGreg Baltazar Pekala, LCSW

## 2017-07-26 NOTE — Progress Notes (Signed)
Adult Psychoeducational Group Note  Date:  07/26/2017 Time:  6:35 PM  Group Topic/Focus:  Developing a Wellness Toolbox:   The focus of this group is to help patients develop a "wellness toolbox" with skills and strategies to promote recovery upon discharge.  Participation Level:  Active  Participation Quality:  Appropriate  Affect:  Appropriate  Cognitive:  Appropriate  Insight: Appropriate  Engagement in Group:  Engaged  Modes of Intervention:  Discussion  Additional Comments:  Pt was eager to share the reason why [facility]. He wanted Simonne ComeLeo and I to help mold his definition of wellness. We helped him find what makes him feel well in his life and identify some goals to work on after [facility].  Keith Irwin 07/26/2017, 6:35 PM

## 2017-07-26 NOTE — Tx Team (Signed)
Interdisciplinary Treatment and Diagnostic Plan Update  07/26/2017 Time of Session: 1035 Keith Irwin MRN: [id number]  Principal Diagnosis: <principal problem not specified>  Secondary Diagnoses: Active Problems:   Severe recurrent major depression without psychotic features (HCC)   Current Medications:  Current Facility-Administered Medications  Medication Dose Route Frequency Provider Last Rate Last Dose  . acetaminophen (TYLENOL) tablet 650 mg  650 mg Oral Q6H PRN Nira Conn A, NP      . alum & mag hydroxide-simeth (MAALOX/MYLANTA) 200-200-20 MG/5ML suspension 30 mL  30 mL Oral Q4H PRN Nira Conn A, NP      . chlordiazePOXIDE (LIBRIUM) capsule 25 mg  25 mg Oral Q6H PRN Cobos, Rockey Situ, MD   25 mg at 07/26/17 0917  . hydrOXYzine (ATARAX/VISTARIL) tablet 25 mg  25 mg Oral Q6H PRN Cobos, Rockey Situ, MD   25 mg at 07/25/17 1900  . loperamide (IMODIUM) capsule 2-4 mg  2-4 mg Oral PRN Cobos, Rockey Situ, MD      . magnesium hydroxide (MILK OF MAGNESIA) suspension 30 mL  30 mL Oral Daily PRN Nira Conn A, NP      . multivitamin with minerals tablet 1 tablet  1 tablet Oral Daily Cobos, Rockey Situ, MD   1 tablet at 07/26/17 0806  . nicotine (NICODERM CQ - dosed in mg/24 hours) patch 21 mg  21 mg Transdermal Daily Cobos, Rockey Situ, MD   21 mg at 07/26/17 0808  . ondansetron (ZOFRAN-ODT) disintegrating tablet 4 mg  4 mg Oral Q6H PRN Cobos, Rockey Situ, MD      . sertraline (ZOLOFT) tablet 50 mg  50 mg Oral Daily Cobos, Rockey Situ, MD   50 mg at 07/26/17 0806  . thiamine (B-1) injection 100 mg  100 mg Intramuscular Once Cobos, Fernando A, MD      . thiamine (VITAMIN B-1) tablet 100 mg  100 mg Oral Daily Cobos, Rockey Situ, MD   100 mg at 07/26/17 0806  . traZODone (DESYREL) tablet 50 mg  50 mg Oral QHS PRN Nira Conn A, NP   50 mg at 07/25/17 2151   PTA Medications: No medications prior to admission.    Patient Stressors: Financial difficulties Marital or family conflict Substance  abuse  Patient Strengths: Supportive family/friends  Treatment Modalities: Medication Management, Group therapy, Case management,  1 to 1 session with clinician, Psychoeducation, Recreational therapy.   Physician Treatment Plan for Primary Diagnosis: <principal problem not specified> Long Term Goal(s): Improvement in symptoms so as ready for discharge Improvement in symptoms so as ready for discharge   Short Term Goals: Ability to identify changes in lifestyle to reduce recurrence of condition will improve Ability to verbalize feelings will improve Ability to disclose and discuss suicidal ideas Ability to demonstrate self-control will improve Ability to identify and develop effective coping behaviors will improve Ability to identify changes in lifestyle to reduce recurrence of condition will improve Ability to maintain clinical measurements within normal limits will improve  Medication Management: Evaluate patient's response, side effects, and tolerance of medication regimen.  Therapeutic Interventions: 1 to 1 sessions, Unit Group sessions and Medication administration.  Evaluation of Outcomes: Progressing  Physician Treatment Plan for Secondary Diagnosis: Active Problems:   Severe recurrent major depression without psychotic features (HCC)  Long Term Goal(s): Improvement in symptoms so as ready for discharge Improvement in symptoms so as ready for discharge   Short Term Goals: Ability to identify changes in lifestyle to reduce recurrence of condition will improve Ability to verbalize  feelings will improve Ability to disclose and discuss suicidal ideas Ability to demonstrate self-control will improve Ability to identify and develop effective coping behaviors will improve Ability to identify changes in lifestyle to reduce recurrence of condition will improve Ability to maintain clinical measurements within normal limits will improve     Medication Management: Evaluate patient's  response, side effects, and tolerance of medication regimen.  Therapeutic Interventions: 1 to 1 sessions, Unit Group sessions and Medication administration.  Evaluation of Outcomes: Progressing   RN Treatment Plan for Primary Diagnosis: <principal problem not specified> Long Term Goal(s): Knowledge of disease and therapeutic regimen to maintain health will improve  Short Term Goals: Ability to identify and develop effective coping behaviors will improve and Compliance with prescribed medications will improve  Medication Management: RN will administer medications as ordered by provider, will assess and evaluate patient's response and provide education to patient for prescribed medication. RN will report any adverse and/or side effects to prescribing provider.  Therapeutic Interventions: 1 on 1 counseling sessions, Psychoeducation, Medication administration, Evaluate responses to treatment, Monitor vital signs and CBGs as ordered, Perform/monitor CIWA, COWS, AIMS and Fall Risk screenings as ordered, Perform wound care treatments as ordered.  Evaluation of Outcomes: Progressing   LCSW Treatment Plan for Primary Diagnosis: <principal problem not specified> Long Term Goal(s): Safe transition to appropriate next level of care at discharge, Engage patient in therapeutic group addressing interpersonal concerns.  Short Term Goals: Engage patient in aftercare planning with referrals and resources, Increase social support and Increase skills for wellness and recovery  Therapeutic Interventions: Assess for all discharge needs, 1 to 1 time with Social worker, Explore available resources and support systems, Assess for adequacy in community support network, Educate family and significant other(s) on suicide prevention, Complete Psychosocial Assessment, Interpersonal group therapy.  Evaluation of Outcomes: Progressing   Progress in Treatment: Attending groups: Yes. Participating in groups:  Yes. Taking medication as prescribed: Yes. Toleration medication: Yes. Family/Significant other contact made: No, will contact:  when given permission Patient understands diagnosis: Yes. Discussing patient identified problems/goals with staff: Yes. Medical problems stabilized or resolved: Yes. Denies suicidal/homicidal ideation: Yes. Issues/concerns per patient self-inventory: No. Other: none  New problem(s) identified: No, Describe:  none  New Short Term/Long Term Goal(s):Pt goal: to quit drinking and live more for my son.  Discharge Plan or Barriers:   Reason for Continuation of Hospitalization: Depression Medication stabilization  Estimated Length of Stay: 2-4 days.  Attendees: Patient:Keith Irwin 07/26/2017   Physician: Dr. Jama Flavorsobos, MD 07/26/2017   Nursing: Quintella ReichertBeverly Knight, RN 07/26/2017   RN Care Manager: 07/26/2017   Social Worker: Daleen SquibbGreg Carmon Sahli, LCSW 07/26/2017   Recreational Therapist:  07/26/2017   Other:  07/26/2017   Other:  07/26/2017   Other: 07/26/2017     Scribe for Treatment Team: Lorri FrederickWierda, Kayln Garceau Jon, LCSW 07/26/2017 2:46 PM

## 2017-07-26 NOTE — Progress Notes (Addendum)
Patient ID: Keith Irwin, male   DOB: 04/29/1975, 42 y.o.   MRN: [id number]  Nursing Progress Note [id number]  Data: Patient presents with flat affect and depressed mood. Patient complaint with scheduled medications. Patient denies SI/HI/AVH or pain. Patient contracts for safety on the unit at this time. Patient completed self-inventory sheet and rates depression, hopelessness, and anxiety 0,0,2 respectively. Patient rates their sleep and appetite as fair/good respectively. Patient states goal for today is to "socialize" and "talk to my son on the phone". Patient withdrawal symptoms appear low at this time, but patient's blood pressure was elevated this morning. MD notified, verbal order given to provide patient with PRN Librium. Patient increasingly anxious about his court date tomorrow. SW has been provided a copy of patient's lawyer's contact card by Clinical research associatewriter. Patient repeatedly states, "I can't go to jail". Patient later remorseful for his irritability and states to staff, "thank ya'll for everything you do".  Action: Patient educated about and provided medication per provider's orders. Patient safety maintained with q15 min safety checks. Low fall risk precautions in place. Emotional support given. 1:1 interaction and active listening provided. Patient encouraged to attend meals and groups. Labs, vital signs and patient behavior monitored throughout shift. Patient encouraged to work on treatment plan and goals.  Response: Patient remains safe on the unit at this time. Will continue to support and monitor.

## 2017-07-26 NOTE — Progress Notes (Signed)
D    Pt is depressed and sad   He is having trouble setteling down this evening and said he is edgy   He first refused extra withdrawal medications but came up later and requested medication to calm his nerves A   Verbal support given   Medications administered and effectiveness monitored   Q 15 min checks R   Pt is safe at present and receptive to verbal support
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
## 2017-07-27 DIAGNOSIS — F1010 Alcohol abuse, uncomplicated: Secondary | ICD-10-CM

## 2017-07-27 DIAGNOSIS — F1290 Cannabis use, unspecified, uncomplicated: Secondary | ICD-10-CM

## 2017-07-27 DIAGNOSIS — F17210 Nicotine dependence, cigarettes, uncomplicated: Secondary | ICD-10-CM

## 2017-07-27 DIAGNOSIS — Z62820 Parent-biological child conflict: Secondary | ICD-10-CM

## 2017-07-27 MED ORDER — TRAZODONE HCL 50 MG PO TABS: 50.0000 mg | ORAL_TABLET | Freq: Every evening | ORAL | 0 refills | Status: DC | PRN

## 2017-07-27 MED ORDER — HYDROXYZINE HCL 25 MG PO TABS: 25.0000 mg | ORAL_TABLET | Freq: Four times a day (QID) | ORAL | 0 refills | Status: DC | PRN

## 2017-07-27 MED ORDER — SERTRALINE HCL 50 MG PO TABS: 50.0000 mg | ORAL_TABLET | Freq: Every day | ORAL | 0 refills | Status: DC

## 2017-07-27 MED ORDER — ACAMPROSATE CALCIUM 333 MG PO TBEC: 666.0000 mg | DELAYED_RELEASE_TABLET | Freq: Three times a day (TID) | ORAL | 0 refills | Status: DC

## 2017-07-27 MED ORDER — NICOTINE 21 MG/24HR TD PT24: 21.0000 mg | MEDICATED_PATCH | Freq: Every day | TRANSDERMAL | 0 refills | Status: AC

## 2017-07-27 NOTE — Progress Notes (Signed)
Patient verbalizes readiness for discharge. Follow up plan explained, AVS, transition record and SRA given along with prescriptions.  All belongings returned. Suicide Safety Plan completed, on chart and copy given to patient. Patient verbalizes understanding. Denies SI/HI and assures this Clinical research associatewriter he will seek assistance should that change. Patient discharged ambulatory and in stable condition to his sister.
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
## 2017-07-27 NOTE — [facility] Suicide Risk Assessment (Signed)
[facility] INPATIENT:  Family/Significant Other Suicide Prevention Education  Suicide Prevention Education:  Education Completed; Keith ReeseRobert Irwin, father [phone]) has been identified by the patient as the family member/significant other with whom the patient will be residing, and identified as the person(s) who will aid the patient in the event of a mental health crisis (suicidal ideations/suicide attempt).  With written consent from the patient, the family member/significant other has been provided the following suicide prevention education, prior to the and/or following the discharge of the patient.  The suicide prevention education provided includes the following:  Suicide risk factors  Suicide prevention and interventions  National Suicide Hotline telephone number  [facility] assessment telephone number  [facility] Emergency Assistance 911  [facility] and/or Residential Mobile Crisis Unit telephone number  Request made of family/significant other to:  Remove weapons (e.g., guns, rifles, knives), all items previously/currently identified as safety concern.    Remove drugs/medications (over-the-counter, prescriptions, illicit drugs), all items previously/currently identified as a safety concern.  The family member/significant other verbalizes understanding of the suicide prevention education information provided.  The family member/significant other agrees to remove the items of safety concern listed above.  Keith Irwin 07/27/2017, 11:04 AM

## 2017-07-27 NOTE — [facility] Group Notes (Signed)
[facility] Group Notes:  (Nursing/MHT/Case Management/Adjunct)  Date:  07/27/2017  Time:  9:00 AM  Type of Therapy:  Nurse Education  Participation Level:  Minimal  Participation Quality:  Appropriate  Affect:  Angry  Cognitive:  Alert  Insight:  Appropriate  Engagement in Group:  Defensive  Modes of Intervention:  Orientation  Summary of Progress/Problems: Goal for today "Talk to my son and get the h* out of here"  Keith MirzaJonathan C Rider Irwin 07/27/2017, 9:00 AM
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
## 2017-07-27 NOTE — Plan of Care (Signed)
Patient verbalizes understanding of information, education provided. 

## 2017-07-27 NOTE — Progress Notes (Signed)
  [facility] :  Will you be returning to the same living situation after discharge:  No. Patient reports moving in with his father at discharge. CSW confirmed this information with the patient's father today.  At discharge, do you have transportation home?: Yes,  patient's sister will pick him up at discharge Do you have the ability to pay for your medications: Yes,  Medicaid  Release of information consent forms completed and in the chart;  Patient's signature needed at discharge.  Patient to Follow up at: Follow-up Information    [facility] Follow [facility].   Why:  Appointment is Wednesday, 07/28/17 at 11:00am. If you cannot make this appointment, walk-in hours are Monday, Wednesday and Friday from 11:00am-12:30pm. Please be sure to bring ID, SSN and any discharge paperwork from your hospitalization.  Contact information: [address] [place], [place] [address]  [phone] Fax:[phone]          Next level of care provider has access to [facility]:yes  Safety Planning and Suicide Prevention discussed: Yes,  with the patient's father  Have you used any form of tobacco in the last 30 days? (Cigarettes, Smokeless Tobacco, Cigars, and/or Pipes): Yes  Has patient been referred to the Quitline?: Patient refused referral  Patient has been referred for addiction treatment: Yes  Maeola SarahJolan E Ronav Furney, LCSWA 07/27/2017, 12:47 PM
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
## 2017-07-27 NOTE — Progress Notes (Signed)
CSW spoke with the patient's father, Crissie ReeseRobert Smith ([phone]) to discuss the patient's discharge plans. Per Mr. Katrinka BlazingSmith the patient is coming to live with him at discharge. He reports that he has no concerns for the patient's safety or the safety of others once the patient is discharged. The patient plans to follow up with [facility] for intensive outpatient substance abuse treatment at discharge.   The patient's father has no further questions or concerns at this time.   Baldo DaubJolan Toddy Boyd, MSW, LCSWA Clinical Social Worker [facility]  Phone: [phone]

## 2020-12-06 ENCOUNTER — Observation Stay (HOSPITAL_COMMUNITY)
Admission: EM | Admit: 2020-12-06 | Discharge: 2020-12-07 | Disposition: A | Discharge status: Home / Self Care | Payer: Medicaid Other | Attending: General Surgery | Admitting: General Surgery

## 2020-12-06 ENCOUNTER — Emergency Department (HOSPITAL_COMMUNITY): Payer: Medicaid Other

## 2020-12-06 ENCOUNTER — Encounter (HOSPITAL_COMMUNITY): Payer: Self-pay | Admitting: *Deleted

## 2020-12-06 ENCOUNTER — Other Ambulatory Visit: Payer: Self-pay

## 2020-12-06 DIAGNOSIS — Z20822 Contact with and (suspected) exposure to covid-19: Secondary | ICD-10-CM | POA: Insufficient documentation

## 2020-12-06 DIAGNOSIS — K5730 Diverticulosis of large intestine without perforation or abscess without bleeding: Secondary | ICD-10-CM | POA: Insufficient documentation

## 2020-12-06 DIAGNOSIS — Y92410 Unspecified street and highway as the place of occurrence of the external cause: Secondary | ICD-10-CM | POA: Insufficient documentation

## 2020-12-06 DIAGNOSIS — F17210 Nicotine dependence, cigarettes, uncomplicated: Secondary | ICD-10-CM | POA: Diagnosis not present

## 2020-12-06 DIAGNOSIS — Y905 Blood alcohol level of 100-119 mg/100 ml: Secondary | ICD-10-CM | POA: Diagnosis not present

## 2020-12-06 DIAGNOSIS — J939 Pneumothorax, unspecified: Secondary | ICD-10-CM | POA: Insufficient documentation

## 2020-12-06 DIAGNOSIS — T1490XA Injury, unspecified, initial encounter: Secondary | ICD-10-CM

## 2020-12-06 DIAGNOSIS — S299XXA Unspecified injury of thorax, initial encounter: Secondary | ICD-10-CM | POA: Diagnosis present

## 2020-12-06 DIAGNOSIS — I69314 Frontal lobe and executive function deficit following cerebral infarction: Secondary | ICD-10-CM | POA: Insufficient documentation

## 2020-12-06 DIAGNOSIS — F1010 Alcohol abuse, uncomplicated: Secondary | ICD-10-CM | POA: Diagnosis not present

## 2020-12-06 DIAGNOSIS — S2241XA Multiple fractures of ribs, right side, initial encounter for closed fracture: Secondary | ICD-10-CM | POA: Diagnosis not present

## 2020-12-06 DIAGNOSIS — S2249XA Multiple fractures of ribs, unspecified side, initial encounter for closed fracture: Secondary | ICD-10-CM | POA: Diagnosis present

## 2020-12-06 LAB — COMPREHENSIVE METABOLIC PANEL
ALT: 31 U/L (ref 0–44)
AST: 35 U/L (ref 15–41)
Albumin: 4 g/dL (ref 3.5–5.0)
Alkaline Phosphatase: 56 U/L (ref 38–126)
Anion gap: 10 (ref 5–15)
BUN: 10 mg/dL (ref 6–20)
CO2: 23 mmol/L (ref 22–32)
Calcium: 8.9 mg/dL (ref 8.9–10.3)
Chloride: 102 mmol/L (ref 98–111)
Creatinine, Ser: 0.88 mg/dL (ref 0.61–1.24)
GFR, Estimated: >60 mL/min (ref >60)
Glucose, Bld: 104 mg/dL — ABNORMAL HIGH (ref 70–99)
Potassium: 3.8 mmol/L (ref 3.5–5.1)
Sodium: 135 mmol/L (ref 135–145)
Total Bilirubin: 0.7 mg/dL (ref 0.3–1.2)
Total Protein: 6.6 g/dL (ref 6.5–8.1)

## 2020-12-06 LAB — CBC
HCT: 44.2 % (ref 39.0–52.0)
Hemoglobin: 14.4 g/dL (ref 13.0–17.0)
MCH: 29.3 pg (ref 26.0–34.0)
MCHC: 32.6 g/dL (ref 30.0–36.0)
MCV: 90 fL (ref 80.0–100.0)
Platelets: 250 10*3/uL (ref 150–400)
RBC: 4.91 MIL/uL (ref 4.22–5.81)
RDW: 12.9 % (ref 11.5–15.5)
WBC: 14.3 10*3/uL — ABNORMAL HIGH (ref 4.0–10.5)
nRBC: 0 % (ref 0.0–0.2)

## 2020-12-06 LAB — I-STAT CHEM 8, ED
BUN: 10 mg/dL (ref 6–20)
Calcium, Ion: 1.02 mmol/L — ABNORMAL LOW (ref 1.15–1.40)
Chloride: 103 mmol/L (ref 98–111)
Creatinine, Ser: 1 mg/dL (ref 0.61–1.24)
Glucose, Bld: 99 mg/dL (ref 70–99)
HCT: 43 % (ref 39.0–52.0)
Hemoglobin: 14.6 g/dL (ref 13.0–17.0)
Potassium: 4 mmol/L (ref 3.5–5.1)
Sodium: 138 mmol/L (ref 135–145)
TCO2: 23 mmol/L (ref 22–32)

## 2020-12-06 LAB — PROTIME-INR
INR: 1 (ref 0.8–1.2)
Prothrombin Time: 12.9 seconds (ref 11.4–15.2)

## 2020-12-06 LAB — ETHANOL: Alcohol, Ethyl (B): 117 mg/dL — ABNORMAL HIGH (ref <10)

## 2020-12-06 LAB — SAMPLE TO BLOOD BANK

## 2020-12-06 LAB — LACTIC ACID, PLASMA: Lactic Acid, Venous: 1.5 mmol/L (ref 0.5–1.9)

## 2020-12-06 MED ORDER — LACTATED RINGERS IV SOLN: INTRAVENOUS | Status: DC

## 2020-12-06 MED ORDER — FOLIC ACID 1 MG PO TABS: 1.0000 mg | ORAL_TABLET | Freq: Every day | ORAL | Status: DC

## 2020-12-06 MED ORDER — ONDANSETRON HCL 4 MG/2ML IJ SOLN: 4.0000 mg | Freq: Four times a day (QID) | INTRAMUSCULAR | Status: DC | PRN

## 2020-12-06 MED ORDER — LORAZEPAM 1 MG PO TABS
1.0000 mg | ORAL_TABLET | ORAL | Status: DC | PRN
Start: 2020-12-06 — End: 2020-12-07

## 2020-12-06 MED ORDER — ACETAMINOPHEN 325 MG PO TABS
650.0000 mg | ORAL_TABLET | Freq: Four times a day (QID) | ORAL | Status: DC
  Administered 2020-12-07: 650 mg via ORAL
  Filled 2020-12-06: qty 2

## 2020-12-06 MED ORDER — DOCUSATE SODIUM 100 MG PO CAPS: 100.0000 mg | ORAL_CAPSULE | Freq: Two times a day (BID) | ORAL | Status: DC

## 2020-12-06 MED ORDER — ENOXAPARIN SODIUM 30 MG/0.3ML IJ SOSY: 30.0000 mg | PREFILLED_SYRINGE | Freq: Two times a day (BID) | INTRAMUSCULAR | Status: DC

## 2020-12-06 MED ORDER — THIAMINE HCL 100 MG PO TABS: 100.0000 mg | ORAL_TABLET | Freq: Every day | ORAL | Status: DC

## 2020-12-06 MED ORDER — THIAMINE HCL 100 MG/ML IJ SOLN: 100.0000 mg | Freq: Every day | INTRAMUSCULAR | Status: DC

## 2020-12-06 MED ORDER — ONDANSETRON 4 MG PO TBDP
4.0000 mg | ORAL_TABLET | Freq: Four times a day (QID) | ORAL | Status: DC | PRN
  Administered 2020-12-07: 4 mg via ORAL
  Filled 2020-12-06: qty 1

## 2020-12-06 MED ORDER — IOHEXOL 350 MG/ML SOLN
75.0000 mL | Freq: Once | INTRAVENOUS | Status: AC | PRN
  Administered 2020-12-06: 75 mL via INTRAVENOUS

## 2020-12-06 MED ORDER — LORAZEPAM 2 MG/ML IJ SOLN
1.0000 mg | INTRAMUSCULAR | Status: DC | PRN
Start: 2020-12-06 — End: 2020-12-07

## 2020-12-06 MED ORDER — OXYCODONE HCL 5 MG PO TABS
5.0000 mg | ORAL_TABLET | ORAL | Status: DC | PRN
  Administered 2020-12-07 (×2): 10 mg via ORAL
  Filled 2020-12-06 (×2): qty 2

## 2020-12-06 MED ORDER — SODIUM CHLORIDE 0.9 % IV SOLN
INTRAVENOUS | Status: AC | PRN
  Administered 2020-12-06: 1000 mL via INTRAVENOUS

## 2020-12-06 MED ORDER — ADULT MULTIVITAMIN W/MINERALS CH: 1.0000 | ORAL_TABLET | Freq: Every day | ORAL | Status: DC

## 2020-12-06 NOTE — ED Notes (Signed)
Pt taken to CT with RN 

## 2020-12-06 NOTE — Progress Notes (Signed)
No respiratory compromise noted. Maintaining good ventilation and oxygenation on RA. Answering questioning appropriately. Airway is patent.  Pauline Pegues L. Katrinka Blazing, BS, RRT-ACCS, RCP
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
## 2020-12-06 NOTE — Progress Notes (Signed)
   12/06/20 2300  Clinical Encounter Type  Visited With Patient  Visit Type Initial  Referral From Nurse  Consult/Referral To Chaplain  Stress Factors  Patient Stress Factors Not reviewed  Family Stress Factors Not reviewed  Chaplain waqs called to Trauma for attending to patient and family if needed.  Patient was alert yet no family present.  Taken to scan.  Will remain available as needed.    Respectfully Submitted,   Revv Amaryllis Dyke
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
## 2020-12-07 ENCOUNTER — Observation Stay (HOSPITAL_COMMUNITY): Payer: Medicaid Other

## 2020-12-07 LAB — URINALYSIS, ROUTINE W REFLEX MICROSCOPIC
Bilirubin Urine: NEGATIVE
Glucose, UA: NEGATIVE mg/dL
Hgb urine dipstick: NEGATIVE
Ketones, ur: NEGATIVE mg/dL
Leukocytes,Ua: NEGATIVE
Nitrite: NEGATIVE
Protein, ur: NEGATIVE mg/dL
Specific Gravity, Urine: 1.025 (ref 1.005–1.030)
pH: 5 (ref 5.0–8.0)

## 2020-12-07 LAB — RESP PANEL BY RT-PCR (FLU A&B, COVID) ARPGX2
Influenza A by PCR: NEGATIVE
Influenza B by PCR: NEGATIVE
SARS Coronavirus 2 by RT PCR: NEGATIVE

## 2020-12-07 LAB — HIV ANTIBODY (ROUTINE TESTING W REFLEX): HIV Screen 4th Generation wRfx: NONREACTIVE

## 2020-12-07 MED ORDER — IBUPROFEN 200 MG PO TABS: 400.0000 mg | ORAL_TABLET | Freq: Three times a day (TID) | ORAL | 2 refills | Status: DC | PRN

## 2020-12-07 MED ORDER — ACETAMINOPHEN 500 MG PO TABS: 1000.0000 mg | ORAL_TABLET | Freq: Four times a day (QID) | ORAL | Status: DC | PRN

## 2020-12-07 MED ORDER — OXYCODONE HCL 5 MG PO TABS: 5.0000 mg | ORAL_TABLET | Freq: Four times a day (QID) | ORAL | 0 refills | Status: DC | PRN

## 2020-12-07 NOTE — TOC Progression Note (Signed)
Transition of Care [facility]) - Progression Note    Patient Details  Name: Keith Irwin MRN: [id number] Date of Birth: [birth year]  [facility] CM/SW Contact  Annalee Genta, LCSW Phone Number: 12/07/2020, 9:31 AM  Clinical Narrative:    CSW received consult for substance use resources. CSW added to AVS. Please reach out to [facility] for further discharge supports.         Expected Discharge Plan and Services           Expected Discharge Date: 12/07/20                                     Social Determinants of Health (SDOH) Interventions    Readmission Risk Interventions No flowsheet data found.

## 2020-12-07 NOTE — ED Notes (Signed)
Help get patient up and dressed patient is now sitting up in bed

## 2020-12-07 NOTE — ED Notes (Signed)
Help get patient undressed on the monitor patient is resting with nurse at bedside 

## 2020-12-07 NOTE — Discharge Instructions (Addendum)
Ice or Heat to rib fractures as needed                    Intensive Outpatient Programs  [facility]    [facility] [address]     [address] [employer],  [place]     [place], [place] [phone]      [phone]  [facility]   [facility]  (Inpatient and outpatient)  [phone] (Suboxone and Methadone) [address]           [phone]           [facility]    [facility] - Intensive Outpatient [address]     [address] [place], [place] [address]     [place], [place]  [phone]      [phone]  [facility] (Outpatient, Inpatient, [facility]  [facility] (Groups and Residental) (insurance only) [phone]    [place], [place]          [phone]       [facility]    [facility] (for caregivers and family) [address]     [address] [place], [place]     [place], [place] [phone]      [phone]  Residential Treatment Programs  [facility]  [facility](2 years) Residential: 90 days)  [facility] ([facility] [address] [facility]      [address] [place], [place]     [place], [place] [phone]      [phone] or [phone]  [facility]    [facility] [address]      [address] [place], [place]     [place], [place] [phone]      [phone]  [facility]   [facility] ([facility]) [address]     [address] [place], [place] [address]     [place], [place] [phone]      [phone] Admissions: 8am-3pm M-F  [facility]: Residential Program [phone] Days)              [facility]: [facility]  [place], [place]     [place], [place]  [phone] or [phone]    (Walk in Hours over the weekend or by referral)   [facility]: Therapeutic  Alternatives:[phone] (for crisis response 24 hours a day)

## 2020-12-07 NOTE — ED Notes (Signed)
Pt given incentive spirometer and instructed on use, pt verbalized understanding and provided return demonstration
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
## 2020-12-07 NOTE — ED Notes (Signed)
Breakfast Ordered 

## 2021-02-21 ENCOUNTER — Encounter (HOSPITAL_COMMUNITY): Payer: Self-pay | Admitting: Emergency Medicine

## 2021-02-21 ENCOUNTER — Other Ambulatory Visit: Payer: Self-pay

## 2021-02-21 ENCOUNTER — Inpatient Hospital Stay (HOSPITAL_COMMUNITY)
Admission: EM | Admit: 2021-02-21 | Discharge: 2021-02-24 | DRG: 392 | Disposition: A | Discharge status: Home / Self Care | Payer: Medicaid Other | Attending: Internal Medicine | Admitting: Internal Medicine

## 2021-02-21 DIAGNOSIS — Z20822 Contact with and (suspected) exposure to covid-19: Secondary | ICD-10-CM | POA: Diagnosis present

## 2021-02-21 DIAGNOSIS — Z66 Do not resuscitate: Secondary | ICD-10-CM | POA: Diagnosis present

## 2021-02-21 DIAGNOSIS — F32A Depression, unspecified: Secondary | ICD-10-CM | POA: Diagnosis present

## 2021-02-21 DIAGNOSIS — K2970 Gastritis, unspecified, without bleeding: Secondary | ICD-10-CM | POA: Diagnosis present

## 2021-02-21 DIAGNOSIS — R6881 Early satiety: Secondary | ICD-10-CM | POA: Diagnosis present

## 2021-02-21 DIAGNOSIS — Z9151 Personal history of suicidal behavior: Secondary | ICD-10-CM

## 2021-02-21 DIAGNOSIS — E876 Hypokalemia: Secondary | ICD-10-CM | POA: Diagnosis present

## 2021-02-21 DIAGNOSIS — K529 Noninfective gastroenteritis and colitis, unspecified: Principal | ICD-10-CM

## 2021-02-21 DIAGNOSIS — K760 Fatty (change of) liver, not elsewhere classified: Secondary | ICD-10-CM | POA: Diagnosis present

## 2021-02-21 DIAGNOSIS — R933 Abnormal findings on diagnostic imaging of other parts of digestive tract: Secondary | ICD-10-CM

## 2021-02-21 DIAGNOSIS — R1084 Generalized abdominal pain: Secondary | ICD-10-CM | POA: Diagnosis present

## 2021-02-21 DIAGNOSIS — Z885 Allergy status to narcotic agent status: Secondary | ICD-10-CM

## 2021-02-21 DIAGNOSIS — Z79899 Other long term (current) drug therapy: Secondary | ICD-10-CM

## 2021-02-21 DIAGNOSIS — F17210 Nicotine dependence, cigarettes, uncomplicated: Secondary | ICD-10-CM | POA: Diagnosis present

## 2021-02-21 NOTE — ED Triage Notes (Signed)
Patient sent by UC for abdominal pain x 2-3 months. Patient states diagnosed with an inflamed colon. Reports sharp central abdominal pain along with nausea. Pt has disc with x-ray from UC.

## 2021-02-22 ENCOUNTER — Encounter (HOSPITAL_COMMUNITY): Payer: Self-pay | Admitting: Student

## 2021-02-22 ENCOUNTER — Emergency Department (HOSPITAL_COMMUNITY): Payer: Medicaid Other

## 2021-02-22 DIAGNOSIS — F32A Depression, unspecified: Secondary | ICD-10-CM | POA: Diagnosis present

## 2021-02-22 DIAGNOSIS — Z79899 Other long term (current) drug therapy: Secondary | ICD-10-CM | POA: Diagnosis not present

## 2021-02-22 DIAGNOSIS — R112 Nausea with vomiting, unspecified: Secondary | ICD-10-CM

## 2021-02-22 DIAGNOSIS — R1084 Generalized abdominal pain: Secondary | ICD-10-CM | POA: Diagnosis not present

## 2021-02-22 DIAGNOSIS — F17210 Nicotine dependence, cigarettes, uncomplicated: Secondary | ICD-10-CM | POA: Diagnosis present

## 2021-02-22 DIAGNOSIS — Z885 Allergy status to narcotic agent status: Secondary | ICD-10-CM | POA: Diagnosis not present

## 2021-02-22 DIAGNOSIS — R935 Abnormal findings on diagnostic imaging of other abdominal regions, including retroperitoneum: Secondary | ICD-10-CM

## 2021-02-22 DIAGNOSIS — Z20822 Contact with and (suspected) exposure to covid-19: Secondary | ICD-10-CM | POA: Diagnosis present

## 2021-02-22 DIAGNOSIS — R6881 Early satiety: Secondary | ICD-10-CM | POA: Diagnosis present

## 2021-02-22 DIAGNOSIS — Z9151 Personal history of suicidal behavior: Secondary | ICD-10-CM | POA: Diagnosis not present

## 2021-02-22 DIAGNOSIS — R197 Diarrhea, unspecified: Secondary | ICD-10-CM | POA: Diagnosis not present

## 2021-02-22 DIAGNOSIS — K529 Noninfective gastroenteritis and colitis, unspecified: Secondary | ICD-10-CM

## 2021-02-22 DIAGNOSIS — K760 Fatty (change of) liver, not elsewhere classified: Secondary | ICD-10-CM | POA: Diagnosis present

## 2021-02-22 DIAGNOSIS — K2970 Gastritis, unspecified, without bleeding: Secondary | ICD-10-CM | POA: Diagnosis present

## 2021-02-22 DIAGNOSIS — Z66 Do not resuscitate: Secondary | ICD-10-CM | POA: Diagnosis present

## 2021-02-22 DIAGNOSIS — R933 Abnormal findings on diagnostic imaging of other parts of digestive tract: Secondary | ICD-10-CM | POA: Diagnosis not present

## 2021-02-22 DIAGNOSIS — E876 Hypokalemia: Secondary | ICD-10-CM | POA: Diagnosis present

## 2021-02-22 LAB — CBC WITH DIFFERENTIAL/PLATELET
Abs Immature Granulocytes: 0.03 10*3/uL (ref 0.00–0.07)
Basophils Absolute: 0 10*3/uL (ref 0.0–0.1)
Basophils Relative: 0 %
Eosinophils Absolute: 0.1 10*3/uL (ref 0.0–0.5)
Eosinophils Relative: 1 %
HCT: 40.6 % (ref 39.0–52.0)
Hemoglobin: 14.1 g/dL (ref 13.0–17.0)
Immature Granulocytes: 0 %
Lymphocytes Relative: 26 %
Lymphs Abs: 2.8 10*3/uL (ref 0.7–4.0)
MCH: 28.5 pg (ref 26.0–34.0)
MCHC: 34.7 g/dL (ref 30.0–36.0)
MCV: 82 fL (ref 80.0–100.0)
Monocytes Absolute: 1.6 10*3/uL — ABNORMAL HIGH (ref 0.1–1.0)
Monocytes Relative: 15 %
Neutro Abs: 6.2 10*3/uL (ref 1.7–7.7)
Neutrophils Relative %: 58 %
Platelets: 275 10*3/uL (ref 150–400)
RBC: 4.95 MIL/uL (ref 4.22–5.81)
RDW: 12.7 % (ref 11.5–15.5)
WBC: 10.8 10*3/uL — ABNORMAL HIGH (ref 4.0–10.5)
nRBC: 0 % (ref 0.0–0.2)

## 2021-02-22 LAB — COMPREHENSIVE METABOLIC PANEL
ALT: 11 U/L (ref 0–44)
AST: 13 U/L — ABNORMAL LOW (ref 15–41)
Albumin: 3.7 g/dL (ref 3.5–5.0)
Alkaline Phosphatase: 80 U/L (ref 38–126)
Anion gap: 10 (ref 5–15)
BUN: 10 mg/dL (ref 6–20)
CO2: 24 mmol/L (ref 22–32)
Calcium: 8.9 mg/dL (ref 8.9–10.3)
Chloride: 101 mmol/L (ref 98–111)
Creatinine, Ser: 0.68 mg/dL (ref 0.61–1.24)
GFR, Estimated: >60 mL/min (ref >60)
Glucose, Bld: 103 mg/dL — ABNORMAL HIGH (ref 70–99)
Potassium: 3 mmol/L — ABNORMAL LOW (ref 3.5–5.1)
Sodium: 135 mmol/L (ref 135–145)
Total Bilirubin: 0.7 mg/dL (ref 0.3–1.2)
Total Protein: 6.2 g/dL — ABNORMAL LOW (ref 6.5–8.1)

## 2021-02-22 LAB — RESP PANEL BY RT-PCR (FLU A&B, COVID) ARPGX2
Influenza A by PCR: NEGATIVE
Influenza B by PCR: NEGATIVE
SARS Coronavirus 2 by RT PCR: NEGATIVE

## 2021-02-22 LAB — C-REACTIVE PROTEIN: CRP: 0.6 mg/dL (ref <1.0)

## 2021-02-22 LAB — LIPASE, BLOOD: Lipase: 28 U/L (ref 11–51)

## 2021-02-22 LAB — SEDIMENTATION RATE: Sed Rate: 2 mm/hr (ref 0–16)

## 2021-02-22 LAB — MAGNESIUM: Magnesium: 1.9 mg/dL (ref 1.7–2.4)

## 2021-02-22 MED ORDER — OXYCODONE-ACETAMINOPHEN 5-325 MG PO TABS
1.0000 | ORAL_TABLET | Freq: Three times a day (TID) | ORAL | Status: DC | PRN
  Administered 2021-02-22 – 2021-02-23 (×4): 1 via ORAL
  Filled 2021-02-22 (×4): qty 1

## 2021-02-22 MED ORDER — LACTATED RINGERS IV SOLN: INTRAVENOUS | Status: AC

## 2021-02-22 MED ORDER — ONDANSETRON HCL 4 MG/2ML IJ SOLN
4.0000 mg | Freq: Four times a day (QID) | INTRAMUSCULAR | Status: DC | PRN
  Administered 2021-02-22: 4 mg via INTRAVENOUS
  Filled 2021-02-22: qty 2

## 2021-02-22 MED ORDER — ACETAMINOPHEN 650 MG RE SUPP: 650.0000 mg | Freq: Four times a day (QID) | RECTAL | Status: DC | PRN

## 2021-02-22 MED ORDER — HYDROMORPHONE HCL 1 MG/ML IJ SOLN
0.5000 mg | Freq: Once | INTRAMUSCULAR | Status: AC
  Administered 2021-02-22: 0.5 mg via INTRAVENOUS
  Filled 2021-02-22: qty 1

## 2021-02-22 MED ORDER — SODIUM CHLORIDE 0.9 % IV BOLUS
1000.0000 mL | Freq: Once | INTRAVENOUS | Status: AC
  Administered 2021-02-22: 1000 mL via INTRAVENOUS

## 2021-02-22 MED ORDER — PEG-KCL-NACL-NASULF-NA ASC-C 100 G PO SOLR: 1.0000 | Freq: Once | ORAL | Status: DC

## 2021-02-22 MED ORDER — ENOXAPARIN SODIUM 40 MG/0.4ML IJ SOSY
40.0000 mg | PREFILLED_SYRINGE | INTRAMUSCULAR | Status: DC
  Administered 2021-02-22: 40 mg via SUBCUTANEOUS
  Filled 2021-02-22: qty 0.4

## 2021-02-22 MED ORDER — HYDROMORPHONE HCL 1 MG/ML IJ SOLN
1.0000 mg | Freq: Once | INTRAMUSCULAR | Status: AC
  Administered 2021-02-22: 1 mg via INTRAVENOUS
  Filled 2021-02-22: qty 1

## 2021-02-22 MED ORDER — ONDANSETRON HCL 4 MG PO TABS: 4.0000 mg | ORAL_TABLET | Freq: Four times a day (QID) | ORAL | Status: DC | PRN

## 2021-02-22 MED ORDER — OXYCODONE-ACETAMINOPHEN 5-325 MG PO TABS: 1.0000 | ORAL_TABLET | Freq: Four times a day (QID) | ORAL | Status: DC | PRN

## 2021-02-22 MED ORDER — PEG-KCL-NACL-NASULF-NA ASC-C 100 G PO SOLR
1.0000 | Freq: Once | ORAL | Status: AC
  Administered 2021-02-22: 200 g via ORAL
  Filled 2021-02-22: qty 1

## 2021-02-22 MED ORDER — NICOTINE 21 MG/24HR TD PT24
21.0000 mg | MEDICATED_PATCH | Freq: Every day | TRANSDERMAL | Status: DC
  Administered 2021-02-22 – 2021-02-24 (×2): 21 mg via TRANSDERMAL
  Filled 2021-02-22 (×2): qty 1

## 2021-02-22 MED ORDER — POTASSIUM CHLORIDE 10 MEQ/100ML IV SOLN
10.0000 meq | INTRAVENOUS | Status: AC
  Administered 2021-02-22 (×3): 10 meq via INTRAVENOUS
  Filled 2021-02-22 (×3): qty 100

## 2021-02-22 MED ORDER — HEPARIN SODIUM (PORCINE) 5000 UNIT/ML IJ SOLN
5000.0000 [IU] | Freq: Three times a day (TID) | INTRAMUSCULAR | Status: DC
  Administered 2021-02-23 – 2021-02-24 (×2): 5000 [IU] via SUBCUTANEOUS
  Filled 2021-02-22 (×2): qty 1

## 2021-02-22 MED ORDER — ACETAMINOPHEN 325 MG PO TABS: 650.0000 mg | ORAL_TABLET | Freq: Four times a day (QID) | ORAL | Status: DC | PRN

## 2021-02-22 MED ORDER — ONDANSETRON HCL 4 MG/2ML IJ SOLN
4.0000 mg | Freq: Once | INTRAMUSCULAR | Status: AC
  Administered 2021-02-22: 4 mg via INTRAVENOUS
  Filled 2021-02-22: qty 2

## 2021-02-22 MED ORDER — IOHEXOL 300 MG/ML  SOLN
75.0000 mL | Freq: Once | INTRAMUSCULAR | Status: AC | PRN
  Administered 2021-02-22: 75 mL via INTRAVENOUS

## 2021-02-22 NOTE — Consult Note (Signed)
Referring Provider:  [facility]         Primary Care Physician:  [facility] Primary Gastroenterologist:  None, unassigned           Reason for Consultation:  abd pain, n/v, diarrhea, abnl GI imaging                 ASSESSMENT /  PLAN   45 year old male with prior alcohol abuse, none in the past 2 months, presenting with nausea, vomiting, intermittent diarrhea, abdominal pain and markedly abnormal CT scan abdomen pelvis.  1.  Abdominal pain/nausea/vomiting/diarrhea/abnormal CT --CT scan is markedly abnormal and raises the question of inflammatory bowel disease.  Infection also in the differential but felt less likely.  Ultimately he needs an upper endoscopy and colonoscopy to evaluate for IBD.  We will check an infectious panel just to ensure this is negative but I doubt infection completely explains the abnormalities seen by imaging.  We discussed bowel preparation which may have to be slow given his symptoms but he is willing to proceed with upper endoscopy and colonoscopy while here.  We discussed the risk, benefits and alternatives to upper endoscopy and colonoscopy and he is agreeable and wishes to proceed. --Clear liquid diet --Attempt at bowel preparation for colonoscopy --Follow-up infectious stool panel --I have added inflammatory markers --Avoid antibiotics for now --Would not start steroids until we have diagnosis --Antiemetics as needed  2.  History of alcohol abuse/fatty liver --patient endorses heavy alcohol use for multiple years but none in the past 2 months.  Liver enzymes are not markedly elevated and there is no definitive sign of advanced liver disease.  In fact by imaging his hepatic steatosis has improved which is in keeping with his reported history of 22-month sobriety.  If 2 months abstinent.  HPI:     Keith Irwin is a 45 y.o. male with history of alcohol abuse who presented to the emergency department with ongoing abdominal pain, nausea,  vomiting, intermittent diarrhea.  We are consulted for abdominal pain, nausea, vomiting and diarrhea along with abnormal GI imaging.  He reports that he has had ongoing, intermittent though persistent issues with abdominal pain.  This has affected his appetite and he has intermittent nausea with vomiting.  He reports early satiety as well as loose stools and at times diarrhea with eating.  His abdominal pain is diffuse and can be at times alleviated by vomiting but also by bowel movement.  He denies weight loss.  He has seen blood in the stool on several occasions though none in the past few weeks.  He is also seeing blood with wiping.  He occasionally uses over-the-counter NSAIDs but not on a daily basis.  He does use tobacco with cigarette smoking daily.  He intermittently uses marijuana.  Denies other illicit drug use.  He stopped drinking alcohol entirely 2 months ago but reports prior to this he drank excessively.  He has a hard time quantifying exactly how much on a daily basis.  No definitive family history of inflammatory bowel disease, liver disease or GI tract malignancy  He lives in [place].  He is not married.  He has 34 adult, 93 year old, son.  He works Training and development officer.  He does not have routine primary care.  No prior GI evaluation.   Past Medical History:  Diagnosis Date   Medical history non-contributory     Past Surgical History:  Procedure Laterality Date   HARDWARE REMOVAL Left 04/24/2016  Procedure: Left tibia dynamyzation-removal of proximal interlock screw;  Surgeon: Netta Cedars, MD;  Location: [place];  Service: Orthopedics;  Laterality: Left;   I & D EXTREMITY Left 10/21/2015   Procedure: IRRIGATION AND DEBRIDEMENT OPEN WOUND LEFT LOWER LEG;  Surgeon: Netta Cedars, MD;  Location: [place];  Service: Orthopedics;  Laterality: Left;   KNEE SURGERY     TIBIA IM NAIL INSERTION Left 10/21/2015   Procedure: INTRAMEDULLARY (IM) NAIL TIBIAL;  Surgeon: Netta Cedars, MD;   Location: [place];  Service: Orthopedics;  Laterality: Left;    Prior to Admission medications   Medication Sig Start Date End Date Taking? Authorizing Provider  acamprosate (CAMPRAL) 333 MG tablet Take 2 tablets (666 mg total) by mouth 3 (three) times daily with meals. For cravings 07/27/17   Money, Lowry Ram, FNP  acetaminophen (TYLENOL) 500 MG tablet Take 2 tablets (1,000 mg total) by mouth every 6 (six) hours as needed. 12/07/20   Saverio Danker, PA-C  hydrOXYzine (ATARAX/VISTARIL) 25 MG tablet Take 1 tablet (25 mg total) by mouth every 6 (six) hours as needed for anxiety. 07/27/17   Money, Lowry Ram, FNP  ibuprofen (MOTRIN IB) 200 MG tablet Take 2-3 tablets (400-600 mg total) by mouth every 8 (eight) hours as needed. 12/07/20 12/07/21  Saverio Danker, PA-C  nicotine (NICODERM CQ - DOSED IN MG/24 HOURS) 21 mg/24hr patch Place 1 patch (21 mg total) onto the skin daily. 07/28/17   Money, Lowry Ram, FNP  oxyCODONE (OXY IR/ROXICODONE) 5 MG immediate release tablet Take 1-2 tablets (5-10 mg total) by mouth every 6 (six) hours as needed (5mg  for moderate pain, 10mg  for severe pain). 12/07/20   Saverio Danker, PA-C  sertraline (ZOLOFT) 50 MG tablet Take 1 tablet (50 mg total) by mouth daily. For mood control 07/28/17   Money, Lowry Ram, FNP  traZODone (DESYREL) 50 MG tablet Take 1 tablet (50 mg total) by mouth at bedtime as needed for sleep. 07/27/17   Money, Lowry Ram, FNP    Current Facility-Administered Medications  Medication Dose Route Frequency Provider Last Rate Last Admin   acetaminophen (TYLENOL) tablet 650 mg  650 mg Oral Q6H PRN Rehman, Areeg N, DO       Or   acetaminophen (TYLENOL) suppository 650 mg  650 mg Rectal Q6H PRN Rehman, Areeg N, DO       enoxaparin (LOVENOX) injection 40 mg  40 mg Subcutaneous Q24H Rehman, Areeg N, DO   40 mg at 02/22/21 0906   lactated ringers infusion   Intravenous Continuous Rehman, Areeg N, DO       nicotine (NICODERM CQ - dosed in mg/24 hours) patch 21 mg  21 mg  Transdermal Daily Rehman, Areeg N, DO       ondansetron (ZOFRAN) tablet 4 mg  4 mg Oral Q6H PRN Rehman, Areeg N, DO       Or   ondansetron (ZOFRAN) injection 4 mg  4 mg Intravenous Q6H PRN Rehman, Areeg N, DO       oxyCODONE-acetaminophen (PERCOCET/ROXICET) 5-325 MG per tablet 1 tablet  1 tablet Oral Q8H PRN Rehman, Areeg N, DO       potassium chloride 10 mEq in 100 mL IVPB  10 mEq Intravenous Q1 Hr x 3 Rehman, Areeg N, DO 100 mL/hr at 02/22/21 0908 10 mEq at 02/22/21 0908   Current Outpatient Medications  Medication Sig Dispense Refill   acamprosate (CAMPRAL) 333 MG tablet Take 2 tablets (666 mg total) by mouth 3 (three) times daily with  meals. For cravings 180 tablet 0   acetaminophen (TYLENOL) 500 MG tablet Take 2 tablets (1,000 mg total) by mouth every 6 (six) hours as needed.     hydrOXYzine (ATARAX/VISTARIL) 25 MG tablet Take 1 tablet (25 mg total) by mouth every 6 (six) hours as needed for anxiety. 30 tablet 0   ibuprofen (MOTRIN IB) 200 MG tablet Take 2-3 tablets (400-600 mg total) by mouth every 8 (eight) hours as needed. 100 tablet 2   nicotine (NICODERM CQ - DOSED IN MG/24 HOURS) 21 mg/24hr patch Place 1 patch (21 mg total) onto the skin daily. 28 patch 0   oxyCODONE (OXY IR/ROXICODONE) 5 MG immediate release tablet Take 1-2 tablets (5-10 mg total) by mouth every 6 (six) hours as needed (5mg  for moderate pain, 10mg  for severe pain). 25 tablet 0   sertraline (ZOLOFT) 50 MG tablet Take 1 tablet (50 mg total) by mouth daily. For mood control 30 tablet 0   traZODone (DESYREL) 50 MG tablet Take 1 tablet (50 mg total) by mouth at bedtime as needed for sleep. 30 tablet 0    Allergies as of 02/21/2021 - Review Complete 02/21/2021  Allergen Reaction Noted   Codeine  12/06/2020   Codeine Other (See Comments) 04/20/2016    History reviewed. No pertinent family history.  Social History   Tobacco Use   Smoking status: Every Day    Packs/day: 1.00    Years: 16.00    Pack years: 16.00     Types: Cigarettes   Smokeless tobacco: Never   Tobacco comments:    refused  Vaping Use   Vaping Use: Never used  Substance Use Topics   Alcohol use: Not Currently    Comment: quit 3 months ago   Drug use: Never    Frequency: 4.0 times per week    Types: Marijuana    Comment: every other day, has been a month since used (04/20/16)    Review of Systems: All systems reviewed and negative except where noted in HPI.  Physical Exam: Vital signs in last 24 hours: Temp:  [98.5 F (36.9 C)-99 F (37.2 C)] 99 F (37.2 C) (11/18 1745) Pulse Rate:  [53-90] 59 (11/19 0800) Resp:  [11-24] 17 (11/19 0800) BP: (108-149)/(74-104) 111/75 (11/19 0800) SpO2:  [96 %-100 %] 96 % (11/19 0800) Weight:  [id number] kg] 63 kg (11/18 1527)   General:   Awake, alert, NAD Psych:  Pleasant, cooperative.  HEENTL  Pupils equal, sclera clear, no icterus, poor dentition Neck:  Supple; no masses Lungs:  Clear throughout to auscultation.   No wheezes, crackles, or rhonchi.  Heart:  Regular rate and rhythm; no murmurs, no lower extremity edema Abdomen:  Soft, scaphoid, diffusely tender, bowel sounds present, no rebound or guarding or palpable mass.    Rectal:  Deferred today Msk:  Symmetrical without gross deformities. . Neurologic:  Alert and  oriented x4;  grossly normal neurologically. Skin:  Intact without significant lesions or rashes.   Intake/Output from previous day: 11/18 0701 - 11/19 0700 In: 1000 [IV Piggyback:1000] Out: -  Intake/Output this shift: No intake/output data recorded.  Lab Results: Recent Labs    02/22/21 0308  WBC 10.8*  HGB 14.1  HCT 40.6  PLT 275   BMET Recent Labs    02/22/21 0308  NA 135  K 3.0*  CL 101  CO2 24  GLUCOSE 103*  BUN 10  CREATININE 0.68  CALCIUM 8.9   LFT Recent Labs    02/22/21 0308  PROT 6.2*  ALBUMIN 3.7  AST 13*  ALT 11  ALKPHOS 80  BILITOT 0.7   PT/INR No results for input(s): LABPROT, INR in the last 72 hours. Hepatitis  Panel No results for input(s): HEPBSAG, HCVAB, HEPAIGM, HEPBIGM in the last 72 hours.   . CBC Latest Ref Rng & Units 02/22/2021 12/06/2020 12/06/2020  WBC 4.0 - 10.5 K/uL 10.8(H) - 14.3(H)  Hemoglobin 13.0 - 17.0 g/dL 14.1 14.6 14.4  Hematocrit 39.0 - 52.0 % 40.6 43.0 44.2  Platelets 150 - 400 K/uL 275 - 250    . CMP Latest Ref Rng & Units 02/22/2021 12/06/2020 12/06/2020  Glucose 70 - 99 mg/dL 103(H) 99 104(H)  BUN 6 - 20 mg/dL 10 10 10   Creatinine 0.61 - 1.24 mg/dL 0.68 1.00 0.88  Sodium 135 - 145 mmol/L 135 138 135  Potassium 3.5 - 5.1 mmol/L 3.0(L) 4.0 3.8  Chloride 98 - 111 mmol/L 101 103 102  CO2 22 - 32 mmol/L 24 - 23  Calcium 8.9 - 10.3 mg/dL 8.9 - 8.9  Total Protein 6.5 - 8.1 g/dL 6.2(L) - 6.6  Total Bilirubin 0.3 - 1.2 mg/dL 0.7 - 0.7  Alkaline Phos 38 - 126 U/L 80 - 56  AST 15 - 41 U/L 13(L) - 35  ALT 0 - 44 U/L 11 - 31   Studies/Results: CT Abdomen Pelvis W Contrast  Result Date: 02/22/2021 CLINICAL DATA:  Acute abdominal pain. EXAM: CT ABDOMEN AND PELVIS WITH CONTRAST TECHNIQUE: Multidetector CT imaging of the abdomen and pelvis was performed using the standard protocol following bolus administration of intravenous contrast. CONTRAST:  29mL OMNIPAQUE IOHEXOL 300 MG/ML  SOLN COMPARISON:  CT with IV contrast 12/06/2020 and 04/01/2014, and the report of a study from 10/21/2015 which images could not be retrieved. FINDINGS: Lower chest: No acute abnormality. Hepatobiliary: The liver is slightly steatotic but less so than on 12/06/2020. No focal lesion is seen. Pancreas: Normal. Spleen: Normal. Adrenals/Urinary Tract: There is no adrenal mass. There is no renal mass enhancement, calculus or hydronephrosis. The bladder wall is unremarkable. Stomach/Bowel: There is increasing fold thickening in the stomach and in the jejunum. In the mid and lower abdomen there are increasingly dilated and thickened small bowel segments, with the distal ileum unremarkable and small bowel dilatation in  the mid and lower abdomen up to 4 cm. There also is fluid in the colon and slight enhancement of the colon wall without inflammatory change. There is mesenteric haziness in the lower abdomen associated with the thickened dilated small bowel segments. The transition to decompressed distal ileum caliber could not be found. Vascular/Lymphatic: There are increased slightly prominent lymph nodes along the mesenteric root. Largest of these is 1 cm in short axis. There is no further adenopathy. Aortic atherosclerosis. Reproductive: Normal prostate. Other: There is no bowel pneumatosis. There is trace ascites in the pelvis. There is no free air, hemorrhage or abscess.Small umbilical fat hernia. Musculoskeletal: No acute or significant osseous findings. IMPRESSION: 1. Compared with 12/06/2020, there are increased findings of gastritis and upper abdominal enteritis. The upper abdominal small bowel is normal caliber but there are also increasingly thickened and dilated ileal segments in the mid and lower abdomen with distal ileal sparing and decompression, and evidence of at least intermediate grade partial small bowel obstruction with transitional segment not identified. There are mesenteric congestive/inflammatory features in the lower abdomen associated with this but no bowel pneumatosis. The wall thickening could be due to infectious or inflammatory enteritis and/or ischemic enteropathy, but  there is normal opacification of the SMA and SMV and branch vessels. Infiltrating wall disease is possible but should not have become this prominent in just 2 months. There is also evidence of at least a mild colitis, with fluid throughout the colon. 2. Minimal pelvic ascites. 3. Development of mildly enlarged mesenteric root lymph nodes likely reactive. There is no bulky or encasing adenopathy. 4. Improved steatosis of the liver since 12/06/2020. Electronically Signed   By: Almira Bar M.D.   On: 02/22/2021 05:12    Principal  Problem:   Generalized abdominal pain Active Problems:   Colitis   Enteritis    Carie Caddy. Orvis Stann, M.D. @  02/22/2021, 9:14 AM
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
## 2021-02-22 NOTE — H&P (View-Only) (Signed)
Referring Provider:  [facility]         Primary Care Physician:  [facility] Primary Gastroenterologist:  None, unassigned           Reason for Consultation:  abd pain, n/v, diarrhea, abnl GI imaging                 ASSESSMENT /  PLAN   45 year old male with prior alcohol abuse, none in the past 2 months, presenting with nausea, vomiting, intermittent diarrhea, abdominal pain and markedly abnormal CT scan abdomen pelvis.  1.  Abdominal pain/nausea/vomiting/diarrhea/abnormal CT --CT scan is markedly abnormal and raises the question of inflammatory bowel disease.  Infection also in the differential but felt less likely.  Ultimately he needs an upper endoscopy and colonoscopy to evaluate for IBD.  We will check an infectious panel just to ensure this is negative but I doubt infection completely explains the abnormalities seen by imaging.  We discussed bowel preparation which may have to be slow given his symptoms but he is willing to proceed with upper endoscopy and colonoscopy while here.  We discussed the risk, benefits and alternatives to upper endoscopy and colonoscopy and he is agreeable and wishes to proceed. --Clear liquid diet --Attempt at bowel preparation for colonoscopy --Follow-up infectious stool panel --I have added inflammatory markers --Avoid antibiotics for now --Would not start steroids until we have diagnosis --Antiemetics as needed  2.  History of alcohol abuse/fatty liver --patient endorses heavy alcohol use for multiple years but none in the past 2 months.  Liver enzymes are not markedly elevated and there is no definitive sign of advanced liver disease.  In fact by imaging his hepatic steatosis has improved which is in keeping with his reported history of 53-month sobriety.  If 2 months abstinent.  HPI:     Keith Irwin is a 45 y.o. male with history of alcohol abuse who presented to the emergency department with ongoing abdominal pain, nausea,  vomiting, intermittent diarrhea.  We are consulted for abdominal pain, nausea, vomiting and diarrhea along with abnormal GI imaging.  He reports that he has had ongoing, intermittent though persistent issues with abdominal pain.  This has affected his appetite and he has intermittent nausea with vomiting.  He reports early satiety as well as loose stools and at times diarrhea with eating.  His abdominal pain is diffuse and can be at times alleviated by vomiting but also by bowel movement.  He denies weight loss.  He has seen blood in the stool on several occasions though none in the past few weeks.  He is also seeing blood with wiping.  He occasionally uses over-the-counter NSAIDs but not on a daily basis.  He does use tobacco with cigarette smoking daily.  He intermittently uses marijuana.  Denies other illicit drug use.  He stopped drinking alcohol entirely 2 months ago but reports prior to this he drank excessively.  He has a hard time quantifying exactly how much on a daily basis.  No definitive family history of inflammatory bowel disease, liver disease or GI tract malignancy  He lives in [place].  He is not married.  He has 56 adult, 38 year old, son.  He works Training and development officer.  He does not have routine primary care.  No prior GI evaluation.   Past Medical History:  Diagnosis Date   Medical history non-contributory     Past Surgical History:  Procedure Laterality Date   HARDWARE REMOVAL Left 04/24/2016  Procedure: Left tibia dynamyzation-removal of proximal interlock screw;  Surgeon: Netta Cedars, MD;  Location: [place] [facility];  Service: Orthopedics;  Laterality: Left;   I & D EXTREMITY Left 10/21/2015   Procedure: IRRIGATION AND DEBRIDEMENT OPEN WOUND LEFT LOWER LEG;  Surgeon: Netta Cedars, MD;  Location: [facility];  Service: Orthopedics;  Laterality: Left;   KNEE SURGERY     TIBIA IM NAIL INSERTION Left 10/21/2015   Procedure: INTRAMEDULLARY (IM) NAIL TIBIAL;  Surgeon: Netta Cedars, MD;   Location: [facility];  Service: Orthopedics;  Laterality: Left;    Prior to Admission medications   Medication Sig Start Date End Date Taking? Authorizing Provider  acamprosate (CAMPRAL) 333 MG tablet Take 2 tablets (666 mg total) by mouth 3 (three) times daily with meals. For cravings 07/27/17   Money, Lowry Ram, FNP  acetaminophen (TYLENOL) 500 MG tablet Take 2 tablets (1,000 mg total) by mouth every 6 (six) hours as needed. 12/07/20   Saverio Danker, PA-C  hydrOXYzine (ATARAX/VISTARIL) 25 MG tablet Take 1 tablet (25 mg total) by mouth every 6 (six) hours as needed for anxiety. 07/27/17   Money, Lowry Ram, FNP  ibuprofen (MOTRIN IB) 200 MG tablet Take 2-3 tablets (400-600 mg total) by mouth every 8 (eight) hours as needed. 12/07/20 12/07/21  Saverio Danker, PA-C  nicotine (NICODERM CQ - DOSED IN MG/24 HOURS) 21 mg/24hr patch Place 1 patch (21 mg total) onto the skin daily. 07/28/17   Money, Lowry Ram, FNP  oxyCODONE (OXY IR/ROXICODONE) 5 MG immediate release tablet Take 1-2 tablets (5-10 mg total) by mouth every 6 (six) hours as needed (5mg  for moderate pain, 10mg  for severe pain). 12/07/20   Saverio Danker, PA-C  sertraline (ZOLOFT) 50 MG tablet Take 1 tablet (50 mg total) by mouth daily. For mood control 07/28/17   Money, Lowry Ram, FNP  traZODone (DESYREL) 50 MG tablet Take 1 tablet (50 mg total) by mouth at bedtime as needed for sleep. 07/27/17   Money, Lowry Ram, FNP    Current Facility-Administered Medications  Medication Dose Route Frequency Provider Last Rate Last Admin   acetaminophen (TYLENOL) tablet 650 mg  650 mg Oral Q6H PRN Rehman, Areeg N, DO       Or   acetaminophen (TYLENOL) suppository 650 mg  650 mg Rectal Q6H PRN Rehman, Areeg N, DO       enoxaparin (LOVENOX) injection 40 mg  40 mg Subcutaneous Q24H Rehman, Areeg N, DO   40 mg at 02/22/21 0906   lactated ringers infusion   Intravenous Continuous Rehman, Areeg N, DO       nicotine (NICODERM CQ - dosed in mg/24 hours) patch 21 mg  21 mg  Transdermal Daily Rehman, Areeg N, DO       ondansetron (ZOFRAN) tablet 4 mg  4 mg Oral Q6H PRN Rehman, Areeg N, DO       Or   ondansetron (ZOFRAN) injection 4 mg  4 mg Intravenous Q6H PRN Rehman, Areeg N, DO       oxyCODONE-acetaminophen (PERCOCET/ROXICET) 5-325 MG per tablet 1 tablet  1 tablet Oral Q8H PRN Rehman, Areeg N, DO       potassium chloride 10 mEq in 100 mL IVPB  10 mEq Intravenous Q1 Hr x 3 Rehman, Areeg N, DO 100 mL/hr at 02/22/21 0908 10 mEq at 02/22/21 0908   Current Outpatient Medications  Medication Sig Dispense Refill   acamprosate (CAMPRAL) 333 MG tablet Take 2 tablets (666 mg total) by mouth 3 (three) times daily with  meals. For cravings 180 tablet 0   acetaminophen (TYLENOL) 500 MG tablet Take 2 tablets (1,000 mg total) by mouth every 6 (six) hours as needed.     hydrOXYzine (ATARAX/VISTARIL) 25 MG tablet Take 1 tablet (25 mg total) by mouth every 6 (six) hours as needed for anxiety. 30 tablet 0   ibuprofen (MOTRIN IB) 200 MG tablet Take 2-3 tablets (400-600 mg total) by mouth every 8 (eight) hours as needed. 100 tablet 2   nicotine (NICODERM CQ - DOSED IN MG/24 HOURS) 21 mg/24hr patch Place 1 patch (21 mg total) onto the skin daily. 28 patch 0   oxyCODONE (OXY IR/ROXICODONE) 5 MG immediate release tablet Take 1-2 tablets (5-10 mg total) by mouth every 6 (six) hours as needed (5mg  for moderate pain, 10mg  for severe pain). 25 tablet 0   sertraline (ZOLOFT) 50 MG tablet Take 1 tablet (50 mg total) by mouth daily. For mood control 30 tablet 0   traZODone (DESYREL) 50 MG tablet Take 1 tablet (50 mg total) by mouth at bedtime as needed for sleep. 30 tablet 0    Allergies as of 02/21/2021 - Review Complete 02/21/2021  Allergen Reaction Noted   Codeine  12/06/2020   Codeine Other (See Comments) 04/20/2016    History reviewed. No pertinent family history.  Social History   Tobacco Use   Smoking status: Every Day    Packs/day: 1.00    Years: 16.00    Pack years: 16.00     Types: Cigarettes   Smokeless tobacco: Never   Tobacco comments:    refused  Vaping Use   Vaping Use: Never used  Substance Use Topics   Alcohol use: Not Currently    Comment: quit 3 months ago   Drug use: Never    Frequency: 4.0 times per week    Types: Marijuana    Comment: every other day, has been a month since used (04/20/16)    Review of Systems: All systems reviewed and negative except where noted in HPI.  Physical Exam: Vital signs in last 24 hours: Temp:  [98.5 F (36.9 C)-99 F (37.2 C)] 99 F (37.2 C) (11/18 1745) Pulse Rate:  [53-90] 59 (11/19 0800) Resp:  [11-24] 17 (11/19 0800) BP: (108-149)/(74-104) 111/75 (11/19 0800) SpO2:  [96 %-100 %] 96 % (11/19 0800) Weight:  LA:[id number] kg] 63 kg (11/18 1527)   General:   Awake, alert, NAD Psych:  Pleasant, cooperative.  HEENTL  Pupils equal, sclera clear, no icterus, poor dentition Neck:  Supple; no masses Lungs:  Clear throughout to auscultation.   No wheezes, crackles, or rhonchi.  Heart:  Regular rate and rhythm; no murmurs, no lower extremity edema Abdomen:  Soft, scaphoid, diffusely tender, bowel sounds present, no rebound or guarding or palpable mass.    Rectal:  Deferred today Msk:  Symmetrical without gross deformities. . Neurologic:  Alert and  oriented x4;  grossly normal neurologically. Skin:  Intact without significant lesions or rashes.   Intake/Output from previous day: 11/18 0701 - 11/19 0700 In: 1000 [IV Piggyback:1000] Out: -  Intake/Output this shift: No intake/output data recorded.  Lab Results: Recent Labs    02/22/21 0308  WBC 10.8*  HGB 14.1  HCT 40.6  PLT 275   BMET Recent Labs    02/22/21 0308  NA 135  K 3.0*  CL 101  CO2 24  GLUCOSE 103*  BUN 10  CREATININE 0.68  CALCIUM 8.9   LFT Recent Labs    02/22/21 0308  PROT 6.2*  ALBUMIN 3.7  AST 13*  ALT 11  ALKPHOS 80  BILITOT 0.7   PT/INR No results for input(s): LABPROT, INR in the last 72 hours. Hepatitis  Panel No results for input(s): HEPBSAG, HCVAB, HEPAIGM, HEPBIGM in the last 72 hours.   . CBC Latest Ref Rng & Units 02/22/2021 12/06/2020 12/06/2020  WBC 4.0 - 10.5 K/uL 10.8(H) - 14.3(H)  Hemoglobin 13.0 - 17.0 g/dL 14.1 14.6 14.4  Hematocrit 39.0 - 52.0 % 40.6 43.0 44.2  Platelets 150 - 400 K/uL 275 - 250    . CMP Latest Ref Rng & Units 02/22/2021 12/06/2020 12/06/2020  Glucose 70 - 99 mg/dL 103(H) 99 104(H)  BUN 6 - 20 mg/dL 10 10 10   Creatinine 0.61 - 1.24 mg/dL 0.68 1.00 0.88  Sodium 135 - 145 mmol/L 135 138 135  Potassium 3.5 - 5.1 mmol/L 3.0(L) 4.0 3.8  Chloride 98 - 111 mmol/L 101 103 102  CO2 22 - 32 mmol/L 24 - 23  Calcium 8.9 - 10.3 mg/dL 8.9 - 8.9  Total Protein 6.5 - 8.1 g/dL 6.2(L) - 6.6  Total Bilirubin 0.3 - 1.2 mg/dL 0.7 - 0.7  Alkaline Phos 38 - 126 U/L 80 - 56  AST 15 - 41 U/L 13(L) - 35  ALT 0 - 44 U/L 11 - 31   Studies/Results: CT Abdomen Pelvis W Contrast  Result Date: 02/22/2021 CLINICAL DATA:  Acute abdominal pain. EXAM: CT ABDOMEN AND PELVIS WITH CONTRAST TECHNIQUE: Multidetector CT imaging of the abdomen and pelvis was performed using the standard protocol following bolus administration of intravenous contrast. CONTRAST:  2mL OMNIPAQUE IOHEXOL 300 MG/ML  SOLN COMPARISON:  CT with IV contrast 12/06/2020 and 04/01/2014, and the report of a study from 10/21/2015 which images could not be retrieved. FINDINGS: Lower chest: No acute abnormality. Hepatobiliary: The liver is slightly steatotic but less so than on 12/06/2020. No focal lesion is seen. Pancreas: Normal. Spleen: Normal. Adrenals/Urinary Tract: There is no adrenal mass. There is no renal mass enhancement, calculus or hydronephrosis. The bladder wall is unremarkable. Stomach/Bowel: There is increasing fold thickening in the stomach and in the jejunum. In the mid and lower abdomen there are increasingly dilated and thickened small bowel segments, with the distal ileum unremarkable and small bowel dilatation in  the mid and lower abdomen up to 4 cm. There also is fluid in the colon and slight enhancement of the colon wall without inflammatory change. There is mesenteric haziness in the lower abdomen associated with the thickened dilated small bowel segments. The transition to decompressed distal ileum caliber could not be found. Vascular/Lymphatic: There are increased slightly prominent lymph nodes along the mesenteric root. Largest of these is 1 cm in short axis. There is no further adenopathy. Aortic atherosclerosis. Reproductive: Normal prostate. Other: There is no bowel pneumatosis. There is trace ascites in the pelvis. There is no free air, hemorrhage or abscess.Small umbilical fat hernia. Musculoskeletal: No acute or significant osseous findings. IMPRESSION: 1. Compared with 12/06/2020, there are increased findings of gastritis and upper abdominal enteritis. The upper abdominal small bowel is normal caliber but there are also increasingly thickened and dilated ileal segments in the mid and lower abdomen with distal ileal sparing and decompression, and evidence of at least intermediate grade partial small bowel obstruction with transitional segment not identified. There are mesenteric congestive/inflammatory features in the lower abdomen associated with this but no bowel pneumatosis. The wall thickening could be due to infectious or inflammatory enteritis and/or ischemic enteropathy, but  there is normal opacification of the SMA and SMV and branch vessels. Infiltrating wall disease is possible but should not have become this prominent in just 2 months. There is also evidence of at least a mild colitis, with fluid throughout the colon. 2. Minimal pelvic ascites. 3. Development of mildly enlarged mesenteric root lymph nodes likely reactive. There is no bulky or encasing adenopathy. 4. Improved steatosis of the liver since 12/06/2020. Electronically Signed   By: Almira Bar M.D.   On: 02/22/2021 05:12    Principal  Problem:   Generalized abdominal pain Active Problems:   Colitis   Enteritis    Carie Caddy. Josselyne Onofrio, M.D. @  02/22/2021, 9:14 AM

## 2021-02-22 NOTE — ED Notes (Signed)
Patient made aware of plan of care 

## 2021-02-22 NOTE — ED Provider Notes (Signed)
I provided a substantive portion of the care of this patient.  I personally performed the entirety of the history, exam, and medical decision making for this encounter.  Patient reports a couple months of progressively worsening intermittent symptoms of diarrhea associated with nausea vomiting and abdominal pain.  Patient states that last few days have been significant worsening 4.  States a couple weeks ago he did have some bright red blood in his stools but is not having that at this time.  Patient presented here for further evaluation.  On exam patient has left greater than right abdominal tenderness.  He has severely diminished bowel sounds.  His vital signs are reassuring.  CT scan shows significant changes in his small bowel to include ileum and his large bowel.  Somewhat is thickened consistent with possibly inflammatory versus infectious versus ischemic however his arteries appear to be patent on this non-angiogram. Given the patient's timing of symptoms, mild leukocytosis, bloody stools and thickened ileum concern for possible inflammatory bowel disease.  There is abdominal tenderness and is questionable bowel obstruction on CT (unlikely to be full bowel obstruction secondary to having diarrhea but does have decreased bowel sounds) plan to speak with gastroenterology about the findings.  Suspect patient like needs a colonoscopy and with amount of vomiting and abdominal tenderness he is having prefer to get it done inpatient but will discuss with gastroenterology first.      Marily Memos, MD 02/23/21 [phone]

## 2021-02-22 NOTE — ED Notes (Signed)
Originally wrote that this pt received [place] sandwich and apple juice, this writer did not mean to document this.  Pt did not get [place] sandwich and apple juice.

## 2021-02-22 NOTE — Plan of Care (Signed)
  Problem: Education: Goal: Knowledge of General Education information will improve Description: Including pain rating scale, medication(s)/side effects and non-pharmacologic comfort measures Outcome: Progressing   Problem: Health Behavior/Discharge Planning: Goal: Ability to manage health-related needs will improve Outcome: Progressing   Problem: Clinical Measurements: Goal: Ability to maintain clinical measurements within normal limits will improve Outcome: Progressing Goal: Will remain free from infection Outcome: Progressing Goal: Diagnostic test results will improve Outcome: Progressing   Problem: Nutrition: Goal: Adequate nutrition will be maintained Outcome: Progressing   Problem: Pain Managment: Goal: General experience of comfort will improve Outcome: Progressing   
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
## 2021-02-22 NOTE — ED Notes (Signed)
Pt given [place] sandwich bag and apple juice.

## 2021-02-23 ENCOUNTER — Encounter (HOSPITAL_COMMUNITY)
Admission: EM | Disposition: A | Discharge status: Home / Self Care | Payer: Self-pay | Source: Home / Self Care | Attending: Internal Medicine

## 2021-02-23 ENCOUNTER — Inpatient Hospital Stay (HOSPITAL_COMMUNITY): Payer: Medicaid Other | Admitting: Certified Registered Nurse Anesthetist

## 2021-02-23 ENCOUNTER — Encounter (HOSPITAL_COMMUNITY): Payer: Self-pay | Admitting: Internal Medicine

## 2021-02-23 DIAGNOSIS — R1084 Generalized abdominal pain: Secondary | ICD-10-CM

## 2021-02-23 DIAGNOSIS — E876 Hypokalemia: Secondary | ICD-10-CM

## 2021-02-23 DIAGNOSIS — K529 Noninfective gastroenteritis and colitis, unspecified: Secondary | ICD-10-CM | POA: Diagnosis not present

## 2021-02-23 DIAGNOSIS — K2970 Gastritis, unspecified, without bleeding: Secondary | ICD-10-CM

## 2021-02-23 HISTORY — PX: ESOPHAGOGASTRODUODENOSCOPY (EGD) WITH PROPOFOL: SHX5813

## 2021-02-23 HISTORY — PX: BIOPSY: SHX5522

## 2021-02-23 HISTORY — PX: COLONOSCOPY WITH PROPOFOL: SHX5780

## 2021-02-23 LAB — CBC WITH DIFFERENTIAL/PLATELET
Abs Immature Granulocytes: 0.03 10*3/uL (ref 0.00–0.07)
Basophils Absolute: 0.1 10*3/uL (ref 0.0–0.1)
Basophils Relative: 1 %
Eosinophils Absolute: 0.2 10*3/uL (ref 0.0–0.5)
Eosinophils Relative: 2 %
HCT: 36.9 % — ABNORMAL LOW (ref 39.0–52.0)
Hemoglobin: 12.5 g/dL — ABNORMAL LOW (ref 13.0–17.0)
Immature Granulocytes: 0 %
Lymphocytes Relative: 24 %
Lymphs Abs: 2.1 10*3/uL (ref 0.7–4.0)
MCH: 28.7 pg (ref 26.0–34.0)
MCHC: 33.9 g/dL (ref 30.0–36.0)
MCV: 84.6 fL (ref 80.0–100.0)
Monocytes Absolute: 0.9 10*3/uL (ref 0.1–1.0)
Monocytes Relative: 11 %
Neutro Abs: 5.3 10*3/uL (ref 1.7–7.7)
Neutrophils Relative %: 62 %
Platelets: 246 10*3/uL (ref 150–400)
RBC: 4.36 MIL/uL (ref 4.22–5.81)
RDW: 12.5 % (ref 11.5–15.5)
WBC: 8.6 10*3/uL (ref 4.0–10.5)
nRBC: 0 % (ref 0.0–0.2)

## 2021-02-23 LAB — BASIC METABOLIC PANEL
Anion gap: 6 (ref 5–15)
BUN: 5 mg/dL — ABNORMAL LOW (ref 6–20)
CO2: 25 mmol/L (ref 22–32)
Calcium: 8.2 mg/dL — ABNORMAL LOW (ref 8.9–10.3)
Chloride: 108 mmol/L (ref 98–111)
Creatinine, Ser: 0.7 mg/dL (ref 0.61–1.24)
GFR, Estimated: >60 mL/min (ref >60)
Glucose, Bld: 87 mg/dL (ref 70–99)
Potassium: 3.5 mmol/L (ref 3.5–5.1)
Sodium: 139 mmol/L (ref 135–145)

## 2021-02-23 SURGERY — ESOPHAGOGASTRODUODENOSCOPY (EGD) WITH PROPOFOL
Anesthesia: Monitor Anesthesia Care

## 2021-02-23 MED ORDER — EPHEDRINE SULFATE 50 MG/ML IJ SOLN
INTRAMUSCULAR | Status: DC | PRN
  Administered 2021-02-23: 5 mg via INTRAVENOUS

## 2021-02-23 MED ORDER — LACTATED RINGERS IV SOLN: INTRAVENOUS | Status: DC | PRN

## 2021-02-23 MED ORDER — PROPOFOL 500 MG/50ML IV EMUL
INTRAVENOUS | Status: DC | PRN
  Administered 2021-02-23: 150 ug/kg/min via INTRAVENOUS

## 2021-02-23 MED ORDER — ONDANSETRON HCL 4 MG PO TABS
4.0000 mg | ORAL_TABLET | Freq: Two times a day (BID) | ORAL | Status: DC
  Administered 2021-02-23: 4 mg via ORAL
  Filled 2021-02-23: qty 1

## 2021-02-23 MED ORDER — PROPOFOL 10 MG/ML IV BOLUS
INTRAVENOUS | Status: DC | PRN
  Administered 2021-02-23 (×3): 20 mg via INTRAVENOUS

## 2021-02-23 MED ORDER — SODIUM CHLORIDE 0.9 % IV SOLN: INTRAVENOUS | Status: DC

## 2021-02-23 MED ORDER — GLYCOPYRROLATE 0.2 MG/ML IJ SOLN
INTRAMUSCULAR | Status: DC | PRN
  Administered 2021-02-23: .2 mg via INTRAVENOUS

## 2021-02-23 MED ORDER — LOPERAMIDE HCL 2 MG PO CAPS
2.0000 mg | ORAL_CAPSULE | Freq: Every day | ORAL | Status: DC
  Filled 2021-02-23: qty 1

## 2021-02-23 MED ORDER — HYDROXYZINE HCL 25 MG PO TABS
50.0000 mg | ORAL_TABLET | Freq: Once | ORAL | Status: AC
  Administered 2021-02-23: 50 mg via ORAL
  Filled 2021-02-23: qty 2

## 2021-02-23 SURGICAL SUPPLY — 25 items

## 2021-02-23 NOTE — Interval H&P Note (Signed)
History and Physical Interval Note:  02/23/2021 9:48 AM  Keith Irwin  has presented today for surgery, with the diagnosis of Abdominal pain, nausea, vomiting, diarrhea and abnormal CT scan.  The various methods of treatment have been discussed with the patient and family. After consideration of risks, benefits and other options for treatment, the patient has consented to  Procedure(s): ESOPHAGOGASTRODUODENOSCOPY (EGD) WITH PROPOFOL (N/A) COLONOSCOPY WITH PROPOFOL (N/A) as a surgical intervention.  The patient's history has been reviewed, patient examined, no change in status, stable for surgery.  I have reviewed the patient's chart and labs.  Questions were answered to the patient's satisfaction.     Rachael Fee

## 2021-02-23 NOTE — Anesthesia Postprocedure Evaluation (Signed)
Anesthesia Post Note  Patient: Keith Irwin  Procedure(s) Performed: ESOPHAGOGASTRODUODENOSCOPY (EGD) WITH PROPOFOL COLONOSCOPY WITH PROPOFOL     Patient location during evaluation: PACU Anesthesia Type: MAC Level of consciousness: awake Pain management: pain level controlled Vital Signs Assessment: post-procedure vital signs reviewed and stable Respiratory status: spontaneous breathing, nonlabored ventilation, respiratory function stable and patient connected to nasal cannula oxygen Cardiovascular status: blood pressure returned to baseline and stable Postop Assessment: no apparent nausea or vomiting Anesthetic complications: no   No notable events documented.  Last Vitals:  Vitals:   02/23/21 1122 02/23/21 1650  BP: 135/77 115/76  Pulse: (!) 49 (!) 58  Resp: 16 18  Temp: 36.7 C 36.6 C  SpO2: 100% 97%    Last Pain:  Vitals:   02/23/21 1122  TempSrc: Oral  PainSc:                  Kayloni Rocco P Timothy Trudell

## 2021-02-23 NOTE — Op Note (Signed)
[facility] Patient Name: Keith Irwin Procedure Date : 02/23/2021 MRN: [id number] Attending MD: Rachael Fee , MD Date of Birth: 1975/11/10 CSN: [id number] Age: 45 Admit Type: Inpatient Procedure:                Upper GI endoscopy Indications:              Nausea with vomiting Providers:                Rachael Fee, MD, Nehemiah Settle Person, Michele Mcalpine                            Technician Referring MD:              Medicines:                Monitored Anesthesia Care Complications:            No immediate complications. Estimated blood loss:                            None. Estimated Blood Loss:     Estimated blood loss: none. Procedure:                Pre-Anesthesia Assessment:                           - Prior to the procedure, a History and Physical                            was performed, and patient medications and                            allergies were reviewed. The patient's tolerance of                            previous anesthesia was also reviewed. The risks                            and benefits of the procedure and the sedation                            options and risks were discussed with the patient.                            All questions were answered, and informed consent                            was obtained. Prior Anticoagulants: The patient has                            taken no previous anticoagulant or antiplatelet                            agents. ASA Grade Assessment: II - A patient with                            mild  systemic disease. After reviewing the risks                            and benefits, the patient was deemed in                            satisfactory condition to undergo the procedure.                           After obtaining informed consent, the endoscope was                            passed under direct vision. Throughout the                            procedure, the patient's blood pressure, pulse, and                             oxygen saturations were monitored continuously. The                            [id number]-[id number] ([id number] Olympus endoscope was introduced                            through the mouth, and advanced to the second part                            of duodenum. The upper GI endoscopy was                            accomplished without difficulty. The patient                            tolerated the procedure well. Scope In: Scope Out: Findings:      Mild inflammation characterized by erythema and granularity was found in       the entire examined stomach. Biopsies were taken with a cold forceps for       histology.      The exam was otherwise without abnormality. Impression:               - Mild, non-specific gastritis. Biopsied                           - The examination was otherwise normal. Recommendation:           - Await pathology results.                           - I will order solid diet.                           - Empiric antiemetics (scheduled zofran) and once                            daily imodium for now. Await final path results  from colon, stomach before deciding on specific                            treatments Procedure Code(s):        --- Professional ---                           [phone], Esophagogastroduodenoscopy, flexible,                            transoral; with biopsy, single or multiple Diagnosis Code(s):        --- Professional ---                           K29.70, Gastritis, unspecified, without bleeding                           R11.2, Nausea with vomiting, unspecified CPT copyright 2019 [employer] Association. All rights reserved. The codes documented in this report are preliminary and upon coder review may  be revised to meet current compliance requirements. Rachael Fee, MD 02/23/2021 10:30:08 AM This report has been signed electronically. Number of Addenda: 0

## 2021-02-23 NOTE — Anesthesia Preprocedure Evaluation (Addendum)
Anesthesia Evaluation  Patient identified by MRN, date of birth, ID band Patient awake    Reviewed: Allergy & Precautions, NPO status , Patient's Chart, lab work & pertinent test results  Airway Mallampati: III  TM Distance: >3 FB Neck ROM: Full    Dental  (+) Poor Dentition, Missing   Pulmonary Current Smoker and Patient abstained from smoking.,    Pulmonary exam normal breath sounds clear to auscultation       Cardiovascular negative cardio ROS Normal cardiovascular exam Rhythm:Regular Rate:Normal  ECG: SR, rate 64   Neuro/Psych PSYCHIATRIC DISORDERS Depression negative neurological ROS     GI/Hepatic negative GI ROS, (+)     substance abuse  ,   Endo/Other  negative endocrine ROS  Renal/GU negative Renal ROS     Musculoskeletal negative musculoskeletal ROS (+)   Abdominal   Peds  Hematology  (+) anemia ,   Anesthesia Other Findings Abdominal pain, nausea, vomiting, diarrhea and abnormal CT scan  Reproductive/Obstetrics                            Anesthesia Physical Anesthesia Plan  ASA: 2  Anesthesia Plan: MAC   Post-op Pain Management:    Induction: Intravenous  PONV Risk Score and Plan: 0 and Propofol infusion and Treatment may vary due to age or medical condition  Airway Management Planned: Nasal Cannula  Additional Equipment:   Intra-op Plan:   Post-operative Plan:   Informed Consent: I have reviewed the patients History and Physical, chart, labs and discussed the procedure including the risks, benefits and alternatives for the proposed anesthesia with the patient or authorized representative who has indicated his/her understanding and acceptance.   Patient has DNR.   Dental advisory given  Plan Discussed with: CRNA  Anesthesia Plan Comments:        Anesthesia Quick Evaluation

## 2021-02-23 NOTE — Op Note (Signed)
[facility] Patient Name: Keith Irwin Procedure Date : 02/23/2021 MRN: [id number] Attending MD: Milus Banister , MD Date of Birth: 1975/08/27 CSN: [id number] Age: 45 Admit Type: Inpatient Procedure:                Colonoscopy Indications:              vomiting, diarrhea, abnormal CT of colon and small                            bowel Providers:                Milus Banister, MD, Jerene Pitch Person, Tyna Jaksch                            Technician Referring MD:              Medicines:                Monitored Anesthesia Care Complications:            No immediate complications. Estimated blood loss:                            None. Estimated Blood Loss:     Estimated blood loss: none. Procedure:                Pre-Anesthesia Assessment:                           - Prior to the procedure, a History and Physical                            was performed, and patient medications and                            allergies were reviewed. The patient's tolerance of                            previous anesthesia was also reviewed. The risks                            and benefits of the procedure and the sedation                            options and risks were discussed with the patient.                            All questions were answered, and informed consent                            was obtained. Prior Anticoagulants: The patient has                            taken no previous anticoagulant or antiplatelet                            agents. ASA Grade Assessment: II -  A patient with                            mild systemic disease. After reviewing the risks                            and benefits, the patient was deemed in                            satisfactory condition to undergo the procedure.                           After obtaining informed consent, the colonoscope                            was passed under direct vision. Throughout the                             procedure, the patient's blood pressure, pulse, and                            oxygen saturations were monitored continuously. The                            [id number] Olympus coloscope was                            introduced through the anus and advanced to the the                            terminal ileum. The colonoscopy was performed                            without difficulty. The patient tolerated the                            procedure well. Scope In: 9:54:44 AM Scope Out: 10:08:03 AM Scope Withdrawal Time: 0 hours 8 minutes 57 seconds  Total Procedure Duration: 0 hours 13 minutes 19 seconds  Findings:      The terminal ileum appeared normal. Biopsies were taken with a cold       forceps for histology. jar 1      The colon was slightly erythematous from rectum to cecum. This was       biopsied extensively. Right colon jar 2. Left colon jar 3. Impression:               - The examined portion of the ileum was normal.                            Biopsied, jar 1.                           - The colon was slightly erythematous from rectum  to cecum. This was biopsied extensively. Right                            colon jar 2. Left colon jar 3.                           - The examination was otherwise normal on direct                            and retroflexion views. Recommendation:           - EGD now,                           - Await final path results Procedure Code(s):        --- Professional ---                           [phone], Colonoscopy, flexible; with biopsy, single                            or multiple Diagnosis Code(s):        --- Professional ---                           K52.9, Noninfective gastroenteritis and colitis,                            unspecified CPT copyright 2019 [employer]. All rights reserved. The codes documented in this report are preliminary and upon coder review may  be revised to meet  current compliance requirements. Rachael Fee, MD 02/23/2021 10:23:16 AM This report has been signed electronically. Number of Addenda: 0

## 2021-02-23 NOTE — Progress Notes (Signed)
Subjective:   Hospital day:1  Overnight event: Patient initially evaluated in the PACU after EGD/colonoscopy.  Patient sleepy but denies any acute complaints.  Patient was reevaluated in the afternoon when he was more awake in his room.  He endorses mild abdominal pain and still nauseous but no vomiting.  He was only able to eat a small portion of his food.  Patient's CODE STATUS was clarified and he has decided to switch to full code.  Objective:  Vital signs in last 24 hours: Vitals:   02/23/21 1037 02/23/21 1052 02/23/21 1107 02/23/21 1122  BP: 99/68 (!) 110/59 107/64 135/77  Pulse: 69 (!) 46 (!) 44 (!) 49  Resp: 19 14 16 16   Temp:  98.1 F (36.7 C)  98 F (36.7 C)  TempSrc:    Oral  SpO2: 100% 100% 99% 100%  Weight:      Height:        Filed Weights   02/21/21 1527 02/23/21 0814  Weight: 63 kg 63 kg     Intake/Output Summary (Last 24 hours) at 02/23/2021 1408 Last data filed at 02/23/2021 1016 Gross per 24 hour  Intake 680 ml  Output 1100 ml  Net -420 ml   Net IO Since Admission: 1,779.58 mL [02/23/21 1408]  No results for input(s): GLUCAP in the last 72 hours.   Pertinent Labs: CBC Latest Ref Rng & Units 02/23/2021 02/22/2021 12/06/2020  WBC 4.0 - 10.5 K/uL 8.6 10.8(H) -  Hemoglobin 13.0 - 17.0 g/dL 12.5(L) 14.1 14.6  Hematocrit 39.0 - 52.0 % 36.9(L) 40.6 43.0  Platelets 150 - 400 K/uL 246 275 -    CMP Latest Ref Rng & Units 02/23/2021 02/22/2021 12/06/2020  Glucose 70 - 99 mg/dL 87 02/05/2021) 99  BUN 6 - 20 mg/dL 5(L) 10 10  Creatinine 0.61 - 1.24 mg/dL 045(W 0.98 1.19  Sodium 135 - 145 mmol/L 139 135 138  Potassium 3.5 - 5.1 mmol/L 3.5 3.0(L) 4.0  Chloride 98 - 111 mmol/L 108 101 103  CO2 22 - 32 mmol/L 25 24 -  Calcium 8.9 - 10.3 mg/dL 8.2(L) 8.9 -  Total Protein 6.5 - 8.1 g/dL - 6.2(L) -  Total Bilirubin 0.3 - 1.2 mg/dL - 0.7 -  Alkaline Phos 38 - 126 U/L - 80 -  AST 15 - 41 U/L - 13(L) -  ALT 0 - 44 U/L - 11 -    Imaging: No results  found.  Physical Exam  General: Thin middle-age man laying in bed. No acute distress. CV: RRR. No m/r/g. No LE edema Pulmonary: Lungs CTAB. Normal effort. No wheezing or rales. Abdominal: Soft.  Mild generalized tenderness.  No distention. Normal bowel sounds. Extremities: 2+ distal pulses. Normal ROM. Skin: Warm and dry. No obvious rash or lesions. Neuro: A&Ox3. No focal deficts Psych: Depressed mood   Assessment/Plan: Trasean Delima is a 45 y.o. male with hx of depression alcohol use disorder presenting with nausea, vomiting and abdominal pain and found to have imaging findings concerning for IBD.  Principal Problem:   Generalized abdominal pain Active Problems:   Colitis   Enteritis  Generalized abdominal pain Enteritis and colitis Infectious work-up remains negative. White count now within normal limits. Sed rate and CRP also within normal limits. Hemoglobin stable at 12.5.  Symptoms and imaging finding concerning for possible IBD.  Colonoscopy today showed normal terminal ileum but slightly erythematous colon from rectum to cecum. Biopsies of the colon were taken. Endoscopy today showed mild, nonspecific gastritis. Biopsies were  also taken.  Patient still with mild abdominal pain and nausea but denies any vomiting.  He was able to tolerate some of his food today. --GI following, appreciate recs --Pending surgical pathology, holding off on starting steroids until formal diagnosis is made --Regular diet --Imodium 2 mg daily and Zofran 4 mg q12hr -- Percocet 5-325 mg q8hr prn for pain   Hypokalemia, resolved K+ of 3.0 improved to 3.5 today after repletion. --Daily BMP   History of severe depression Patient has a history of 2 prior psychiatric admissions in 2010 and 2019 for suicidal ideations. Was previously on sertraline but states that he is no longer taking this medication.  Discussed with patient today about importance of seeking treatment whether his medication or therapy.   --Recommend outpatient CBT   History of alcohol use Patient reports history of heavy alcohol use, quit 2 and half to 3 months ago.  Reports drinking about a 12 pack a day.  Denies any history of withdrawals.  -- We will monitor closely   Current every day smoker Continues to smoke about a pack a day with a 16-pack-year history.   -- Continue nicotine patch 21 mg   CODE STATUS This was clarified with patient today when he was more awake in his room. Patient states he does not have anybody his life besides his sister, no children or wife. States he has gone through a lot in his life and initially did not want CPR he will change it back to full code for now. --Change CODE STATUS from DNR to FULL CODE  Diet: Regular IVF: N/A VTE: Heparin CODE: Full code  Prior to Admission Living Arrangement: Home Anticipated Discharge Location: Home Barriers to Discharge: Medical management Dispo: Anticipated discharge in approximately 1-2 day(s).   Signed: Lacinda Axon, MD 02/23/2021, 2:08 PM  Pager: [phone] Internal Medicine Teaching Service After 5pm on weekdays and 1pm on weekends: On Call pager: [phone]

## 2021-02-23 NOTE — Progress Notes (Signed)
Pt called staff to room stating that he is getting bit by something while in bed. All bed lines and gown changed in attempt to resolve his issue. A moment later, he called staff back in the room with the same complaint. Visual inspection did not show any insects or bugs. Pt kept saying that he did not want to get back in the bed or sit in the recliner chair and that he wanted to be transferred to another room. Unit currently full with no empty rooms. AC called and she came up to the unit and requested stat housekeeping to clean the room and exchange the bed with a clean one

## 2021-02-23 NOTE — Transfer of Care (Signed)
Immediate Anesthesia Transfer of Care Note  Patient: Keith Irwin  Procedure(s) Performed: ESOPHAGOGASTRODUODENOSCOPY (EGD) WITH PROPOFOL COLONOSCOPY WITH PROPOFOL  Patient Location: PACU  Anesthesia Type:MAC  Level of Consciousness: awake, alert  and oriented  Airway & Oxygen Therapy: Patient Spontanous Breathing  Post-op Assessment: Report given to RN and Post -op Vital signs reviewed and stable  Post vital signs: Reviewed and stable  Last Vitals:  Vitals Value Taken Time  BP 112/76 02/23/21 1022  Temp    Pulse 58 02/23/21 1024  Resp 16 02/23/21 1024  SpO2 99 % 02/23/21 1024  Vitals shown include unvalidated device data.  Last Pain:  Vitals:   02/23/21 0814  TempSrc: Temporal  PainSc: 0-No pain      Patients Stated Pain Goal: 4 (67/01/41 0301)  Complications: No notable events documented.

## 2021-02-24 ENCOUNTER — Encounter: Payer: Self-pay | Admitting: *Deleted

## 2021-02-24 DIAGNOSIS — R933 Abnormal findings on diagnostic imaging of other parts of digestive tract: Secondary | ICD-10-CM

## 2021-02-24 DIAGNOSIS — R1084 Generalized abdominal pain: Secondary | ICD-10-CM | POA: Diagnosis not present

## 2021-02-24 LAB — BASIC METABOLIC PANEL
Anion gap: 8 (ref 5–15)
BUN: 7 mg/dL (ref 6–20)
CO2: 26 mmol/L (ref 22–32)
Calcium: 8.5 mg/dL — ABNORMAL LOW (ref 8.9–10.3)
Chloride: 106 mmol/L (ref 98–111)
Creatinine, Ser: 0.75 mg/dL (ref 0.61–1.24)
GFR, Estimated: >60 mL/min (ref >60)
Glucose, Bld: 88 mg/dL (ref 70–99)
Potassium: 3.4 mmol/L — ABNORMAL LOW (ref 3.5–5.1)
Sodium: 140 mmol/L (ref 135–145)

## 2021-02-24 LAB — CBC
HCT: 35.2 % — ABNORMAL LOW (ref 39.0–52.0)
Hemoglobin: 11.9 g/dL — ABNORMAL LOW (ref 13.0–17.0)
MCH: 28.3 pg (ref 26.0–34.0)
MCHC: 33.8 g/dL (ref 30.0–36.0)
MCV: 83.8 fL (ref 80.0–100.0)
Platelets: 240 10*3/uL (ref 150–400)
RBC: 4.2 MIL/uL — ABNORMAL LOW (ref 4.22–5.81)
RDW: 12.5 % (ref 11.5–15.5)
WBC: 9.4 10*3/uL (ref 4.0–10.5)
nRBC: 0 % (ref 0.0–0.2)

## 2021-02-24 MED ORDER — POTASSIUM CHLORIDE 20 MEQ PO PACK
40.0000 meq | PACK | Freq: Once | ORAL | Status: DC
  Filled 2021-02-24: qty 2

## 2021-02-24 MED ORDER — LOPERAMIDE HCL 2 MG PO CAPS: 2.0000 mg | ORAL_CAPSULE | Freq: Every day | ORAL | 0 refills | Status: DC

## 2021-02-24 MED ORDER — PANTOPRAZOLE SODIUM 40 MG PO TBEC
40.0000 mg | DELAYED_RELEASE_TABLET | Freq: Every day | ORAL | Status: DC
  Administered 2021-02-24: 40 mg via ORAL

## 2021-02-24 MED ORDER — ONDANSETRON HCL 4 MG PO TABS: 4.0000 mg | ORAL_TABLET | Freq: Two times a day (BID) | ORAL | 0 refills | Status: DC

## 2021-02-24 MED ORDER — PANTOPRAZOLE SODIUM 40 MG PO TBEC: 40.0000 mg | DELAYED_RELEASE_TABLET | Freq: Every day | ORAL | 1 refills | Status: AC

## 2021-02-24 MED ORDER — HYDROXYZINE HCL 25 MG PO TABS: 25.0000 mg | ORAL_TABLET | Freq: Four times a day (QID) | ORAL | 2 refills | Status: AC | PRN

## 2021-02-24 MED ORDER — OXYCODONE-ACETAMINOPHEN 5-325 MG PO TABS: 1.0000 | ORAL_TABLET | Freq: Three times a day (TID) | ORAL | 0 refills | Status: AC | PRN

## 2021-02-24 MED ORDER — TRAZODONE HCL 50 MG PO TABS: 50.0000 mg | ORAL_TABLET | Freq: Every day | ORAL | 2 refills | Status: AC

## 2021-02-24 NOTE — Progress Notes (Signed)
Discharge instructions given to pt. Pt verbalized understanding of all teaching. Pt currently waiting in room for sister to come pick him up.

## 2021-02-24 NOTE — Progress Notes (Signed)
Mobility Specialist Progress Note:   02/24/21 1100  Mobility  Activity Ambulated in hall  Level of Assistance Independent  Assistive Device None  Distance Ambulated (ft) 560 ft  Mobility Ambulated independently in hallway  Mobility Response Tolerated well  Mobility performed by Mobility specialist  $Mobility charge 1 Mobility   Pt asx during ambulation. Abdominal pain did not increase with exertion. Pt back in bed with all needs met.  Nelta Numbers Mobility Specialist  Phone [phone]

## 2021-02-24 NOTE — Discharge Summary (Signed)
Name: Keith Irwin MRN: [id number] DOB: 1975-11-10 45 y.o. PCP: [facility]  Date of Admission: 02/21/2021  3:19 PM Date of Discharge: 02/24/21 Attending Physician: Inez Catalina, MD  Discharge Diagnosis: 1.  Enteritis/colitis  Discharge Medications: Allergies as of 02/24/2021       Reactions   Codeine    Codeine Other (See Comments)   Unsure of reaction, "feels different"         Medication List     STOP taking these medications    acamprosate 333 MG tablet Commonly known as: CAMPRAL   acetaminophen 500 MG tablet Commonly known as: TYLENOL   ibuprofen 200 MG tablet Commonly known as: Motrin IB   oxyCODONE 5 MG immediate release tablet Commonly known as: Oxy IR/ROXICODONE   sertraline 50 MG tablet Commonly known as: ZOLOFT       TAKE these medications    hydrOXYzine 25 MG tablet Commonly known as: ATARAX/VISTARIL Take 1 tablet (25 mg total) by mouth every 6 (six) hours as needed for anxiety.   loperamide 2 MG capsule Commonly known as: IMODIUM Take 1 capsule (2 mg total) by mouth daily before breakfast. Start taking on: February 25, 2021   nicotine 21 mg/24hr patch Commonly known as: NICODERM CQ - dosed in mg/24 hours Place 1 patch (21 mg total) onto the skin daily.   ondansetron 4 MG tablet Commonly known as: ZOFRAN Take 1 tablet (4 mg total) by mouth every 12 (twelve) hours.   oxyCODONE-acetaminophen 5-325 MG tablet Commonly known as: PERCOCET/ROXICET Take 1 tablet by mouth every 8 (eight) hours as needed for up to 5 days for moderate pain.   pantoprazole 40 MG tablet Commonly known as: PROTONIX Take 1 tablet (40 mg total) by mouth daily.   traZODone 50 MG tablet Commonly known as: DESYREL Take 1 tablet (50 mg total) by mouth at bedtime. What changed:  when to take this reasons to take this        Disposition and follow-up:   Mr.Keith Irwin was discharged from [facility] in Stable  condition.  At the hospital follow up visit please address:  1.  Enteritis/colitis- follow-up with GI Dr. Rhea Belton 03/12/21 @ 930AM; review biopsy results and necessary treatment thereafter.  2.  Labs / imaging needed at time of follow-up: Surgical biopsy of the colon pathology report  3.  Pending labs/ test needing follow-up: None  Follow-up Appointments:  Follow-up Information     [facility], [facility]. Schedule an appointment as soon as possible for a visit in 1 week(s).   Contact information: [address] [place] [address] [phone]         Beverley Fiedler, MD. Go on 03/12/2021.   Specialty: Gastroenterology Why: 930AM appointment Contact information: [address] [place] [address] [phone]                 Hospital Course by problem list: 1.  Enteritis/colitis-patient presented to the ED complaining of Abdominal pain, nausea, vomiting and intermittent bouts of bloody diarrhea ongoing for several months.  GI was consulted.  Patient was started on clear liquid diet and advance as tolerated.  Patient received Percocet for pain control.  Patient received Zofran for nausea and Imodium for diarrhea.  And IV fluids for hydration.  EGD and colonoscopy was performed per GI.  EGD results revealed gastritis.  Colonoscopy results reveal normal terminal ileum but slightly erythematous colon from rectum to cecum.  Biopsies of  the colon were taken.  Pending results.  At discharge patient was stable and reports improved abdominal pain.  Patient was able to tolerate food/liquids.  Patient is to follow-up with GI outpatient.  Discharge Exam:   BP 116/73 (BP Location: Right Arm)   Pulse (!) 53   Temp 98.8 F (37.1 C) (Oral)   Resp 20   Ht 5\' 11"  (1.803 m)   Wt 63 kg   SpO2 98%   BMI 19.39 kg/m  Discharge exam: Physical Exam Constitutional:      General: He is not in acute distress.    Appearance: He is normal weight.  HENT:     Head: Normocephalic  and atraumatic.  Cardiovascular:     Rate and Rhythm: Normal rate and regular rhythm.     Heart sounds: Normal heart sounds.  Pulmonary:     Effort: Pulmonary effort is normal.     Breath sounds: Normal breath sounds.  Abdominal:     General: Abdomen is flat.     Tenderness: There is abdominal tenderness in the epigastric area.  Skin:    General: Skin is warm and dry.  Neurological:     General: No focal deficit present.     Mental Status: He is alert.  Psychiatric:        Mood and Affect: Mood is anxious.     Pertinent Labs, Studies, and Procedures:  CT ABDOMEN AND PELVIS WITH CONTRAST   TECHNIQUE: Multidetector CT imaging of the abdomen and pelvis was performed using the standard protocol following bolus administration of intravenous contrast.   CONTRAST:  41mL OMNIPAQUE IOHEXOL 300 MG/ML  SOLN   COMPARISON:  CT with IV contrast 12/06/2020 and 04/01/2014, and the report of a study from 10/21/2015 which images could not be retrieved.   FINDINGS: Lower chest: No acute abnormality.   Hepatobiliary: The liver is slightly steatotic but less so than on 12/06/2020. No focal lesion is seen.   Pancreas: Normal.   Spleen: Normal.   Adrenals/Urinary Tract: There is no adrenal mass. There is no renal mass enhancement, calculus or hydronephrosis. The bladder wall is unremarkable.   Stomach/Bowel: There is increasing fold thickening in the stomach and in the jejunum. In the mid and lower abdomen there are increasingly dilated and thickened small bowel segments, with the distal ileum unremarkable and small bowel dilatation in the mid and lower abdomen up to 4 cm.   There also is fluid in the colon and slight enhancement of the colon wall without inflammatory change. There is mesenteric haziness in the lower abdomen associated with the thickened dilated small bowel segments. The transition to decompressed distal ileum caliber could not be found.   Vascular/Lymphatic:  There are increased slightly prominent lymph nodes along the mesenteric root. Largest of these is 1 cm in short axis. There is no further adenopathy. Aortic atherosclerosis.   Reproductive: Normal prostate.   Other: There is no bowel pneumatosis. There is trace ascites in the pelvis. There is no free air, hemorrhage or abscess.Small umbilical fat hernia.   Musculoskeletal: No acute or significant osseous findings.   IMPRESSION: 1. Compared with 12/06/2020, there are increased findings of gastritis and upper abdominal enteritis. The upper abdominal small bowel is normal caliber but there are also increasingly thickened and dilated ileal segments in the mid and lower abdomen with distal ileal sparing and decompression, and evidence of at least intermediate grade partial small bowel obstruction with transitional segment not identified. There are mesenteric congestive/inflammatory features in the  lower abdomen associated with this but no bowel pneumatosis. The wall thickening could be due to infectious or inflammatory enteritis and/or ischemic enteropathy, but there is normal opacification of the SMA and SMV and branch vessels. Infiltrating wall disease is possible but should not have become this prominent in just 2 months. There is also evidence of at least a mild colitis, with fluid throughout the colon. 2. Minimal pelvic ascites. 3. Development of mildly enlarged mesenteric root lymph nodes likely reactive. There is no bulky or encasing adenopathy. 4. Improved steatosis of the liver since 12/06/2020.  Colonoscopy The examined portion of the ileum was normal. Biopsied, jar 1. - The colon was slightly erythematous from rectum to cecum. This was biopsied extensively. Right colon jar 2. Left colon jar 3. - The examination was otherwise normal on direct and retroflexion views.  EGD Mild, non-specific gastritis. Biopsied - The examination was otherwise normal.  Discharge  Instructions: Discharge Instructions     Call MD for:  difficulty breathing, headache or visual disturbances   Complete by: As directed    Call MD for:  extreme fatigue   Complete by: As directed    Call MD for:  hives   Complete by: As directed    Call MD for:  persistant dizziness or light-headedness   Complete by: As directed    Call MD for:  persistant nausea and vomiting   Complete by: As directed    Call MD for:  redness, tenderness, or signs of infection (pain, swelling, redness, odor or green/yellow discharge around incision site)   Complete by: As directed    Call MD for:  severe uncontrolled pain   Complete by: As directed    Call MD for:  temperature >100.4   Complete by: As directed    Diet - low sodium heart healthy   Complete by: As directed    Increase activity slowly   Complete by: As directed        Signed: Timothy Lasso, MD 02/24/2021, 2:00 PM   Pager: [phone]

## 2021-02-24 NOTE — Progress Notes (Incomplete)
° ° °  Subjective: ***  Objective:  Vital signs in last 24 hours: Vitals:   02/23/21 1122 02/23/21 1650 02/23/21 2141 02/24/21 0542  BP: 135/77 115/76 110/73 117/72  Pulse: (!) 49 (!) 58 (!) 52 (!) 50  Resp: 16 18 16 16   Temp: 98 F (36.7 C) 97.9 F (36.6 C) 98.2 F (36.8 C) 98.2 F (36.8 C)  TempSrc: Oral     SpO2: 100% 97% 99% 100%  Weight:      Height:       CBC Latest Ref Rng & Units 02/24/2021 02/23/2021 02/22/2021  WBC 4.0 - 10.5 K/uL 9.4 8.6 10.8(H)  Hemoglobin 13.0 - 17.0 g/dL 11.9(L) 12.5(L) 14.1  Hematocrit 39.0 - 52.0 % 35.2(L) 36.9(L) 40.6  Platelets 150 - 400 K/uL 240 246 275   BMP Latest Ref Rng & Units 02/24/2021 02/23/2021 02/22/2021  Glucose 70 - 99 mg/dL 88 87 02/24/2021)  BUN 6 - 20 mg/dL 7 5(L) 10  Creatinine 161(W - 1.24 mg/dL 9.60 4.54 0.98  Sodium 135 - 145 mmol/L 140 139 135  Potassium 3.5 - 5.1 mmol/L 3.4(L) 3.5 3.0(L)  Chloride 98 - 111 mmol/L 106 108 101  CO2 22 - 32 mmol/L 26 25 24   Calcium 8.9 - 10.3 mg/dL 1.19) ) 8.9    ***  Assessment/Plan:  Principal Problem:   Generalized abdominal pain Active Problems:   Colitis   Enteritis   Keith Irwin is a 45 y.o. male with hx of depression, alcohol use disorder, presenting with nausea, vomiting and abdominal pain and found to have imaging findings (gastritis, upper abd enteritis) concerning for IBD.   Generalized abdominal pain Enteritis and colitis  Pathology results pending  Infectious work-up remains negative. White count now within normal limits. Sed rate and CRP also within normal limits. Hemoglobin stable at 12.5.  Symptoms and imaging finding concerning for possible IBD.  Colonoscopy today showed normal terminal ileum but slightly erythematous colon from rectum to cecum. Biopsies of the colon were taken. Endoscopy today showed mild, nonspecific gastritis. Biopsies were also taken.  Patient still with mild abdominal pain and nausea but denies any vomiting.  He was able to tolerate  some of his food today. --GI following, appreciate recs --Pending surgical pathology, holding off on starting steroids until formal diagnosis is made --Regular diet --Imodium 2 mg daily and Zofran 4 mg q12hr -- Percocet 5-325 mg q8hr prn for pain   Hypokalemia, resolved K+ of 3.0 improved to 3.5 today after repletion. --Daily BMP   History of severe depression Patient has a history of 2 prior psychiatric admissions in 2010 and 2019 for suicidal ideations. Was previously on sertraline but states that he is no longer taking this medication.  Discussed with patient today about importance of seeking treatment whether his medication or therapy.  --Recommend outpatient CBT   History of alcohol use Patient reports history of heavy alcohol use, quit 2 and half to 3 months ago.  Reports drinking about a 12 pack a day.  Denies any history of withdrawals.  -- We will monitor closely   Current every day smoker Continues to smoke about a pack a day with a 16-pack-year history.   -- Continue nicotine patch 21 mg    Prior to Admission Living Arrangement: Anticipated Discharge Location: Barriers to Discharge: Dispo: Anticipated discharge in approximately 1-2 day(s).   2011, MD 02/24/2021, 6:54 AM Pager: [phone] After 5pm on weekdays and 1pm on weekends: On Call pager [phone]

## 2021-02-24 NOTE — Progress Notes (Signed)
    Progress Note   Subjective  Chief complaint: Abdominal pain  Feeling better today. No abdominal pain or nausea. Wants to go home.    Objective  Vital signs in last 24 hours: Temp:  [97.9 F (36.6 C)-98.8 F (37.1 C)] 98.8 F (37.1 C) (11/21 0745) Pulse Rate:  [50-58] 53 (11/21 0745) Resp:  [16-20] 20 (11/21 0745) BP: (110-117)/(72-76) 116/73 (11/21 0745) SpO2:  [97 %-100 %] 98 % (11/21 0745) Last BM Date: 02/23/21  General: Alert, well-developed, in NAD Heart:  Regular rate and rhythm; no murmurs Chest: Clear to ascultation bilaterally Abdomen:  Soft, nontender, ondistended. Normal bowel sounds. There is no rebound or guarding.  Extremities:  Without edema. Neurologic:  Alert and  oriented x 4; grossly normal neurologically. Psych:  Alert and cooperative. Normal mood and affect.  Intake/Output from previous day: 11/20 0701 - 11/21 0700 In: 660 [P.O.:460; I.V.:200] Out: 500 [Urine:500] Intake/Output this shift: No intake/output data recorded.  Lab Results: Recent Labs    02/22/21 0308 02/23/21 0056 02/24/21 0045  WBC 10.8* 8.6 9.4  HGB 14.1 12.5* 11.9*  HCT 40.6 36.9* 35.2*  PLT 275 246 240   BMET Recent Labs    02/22/21 0308 02/23/21 0056 02/24/21 0045  NA 135 139 140  K 3.0* 3.5 3.4*  CL 101 108 106  CO2 _0 GLUCOSE 103* 87 88  BUN 10 5* 7  CREATININE 0.68 0.70 0.75  CALCIUM 8.9 8.2* 8.5*   LFT Recent Labs    02/22/21 0308  PROT 6.2*  ALBUMIN 3.7  AST 13*  ALT 11  ALKPHOS 80  BILITOT 0.7       Assessment & Recommendations   45 year old man with previous alcohol abuse (none for 2 months) admitted with  months of nausea, vomiting, intermittent diarrhea, abdominal pain.  Admitting CT suggested colitis, patchy enteritis, and reactive lymphadenopathy (new compared to CT from 12/06/20). ESR and CRP are normal.  WBC was mildly elevated, but has normalized. Albumin and platelets are also normal. EGD and colonoscopy 02/25/21 showed  slightly erythematous colon that was biopsied extensively, normal terminal ileum.  Mild gastritis only in stomach, biopsies taken for H. pylori.    Suspicious for underlying IBD although infectious etiologies must be considered. Awaiting path results to start medications given his clinical improvement. Add pantoprazole to treat the gastritis. Avoid NSAIDs. Continue empiric antiemetics and Imodium while waiting for pathology results. If biopsies negative for IBD, consider stool studies to evaluate for infectious etiologies (GI pathogen panel was ordered but has not been collected). May have concurrent chronic pancreatitis +/- PEI, although pancreas appeared normal on CT. Continue diet as tolerated.  Continued abstinence from alcohol encouraged.   We will check back after the pathology results are available, and communicate these results with the patient if the result after discharged. I scheduled outpatient follow-up with Dr. Hilarie Fredrickson  03/12/21 at 9:30 am.  Please call with any additional questions or concerns during this hospitalization.  Thornton Park, MD, MPH [facility]          LOS: 2 days   Thornton Park  02/24/2021, 12:20 PM

## 2021-02-24 NOTE — Discharge Instructions (Signed)
Please follow-up with the GI (stomach doctor) when you are discharged so that they can go over your biopsy results and determine treatment.  If your abdominal pain worsens please do not hesitate to come back to the ED.  Take Zofran for nausea and vomiting.  Take Imodium for constipation.  Please continue to stay hydrated as much as possible.

## 2021-02-25 ENCOUNTER — Encounter (HOSPITAL_COMMUNITY): Payer: Self-pay | Admitting: Gastroenterology

## 2021-02-26 LAB — SURGICAL PATHOLOGY

## 2021-03-05 ENCOUNTER — Encounter: Payer: Medicaid Other | Admitting: Internal Medicine

## 2021-03-05 ENCOUNTER — Telehealth: Payer: Self-pay | Admitting: *Deleted

## 2021-03-05 NOTE — Telephone Encounter (Signed)
Pt did not come to his appt this am. Called pt to re-schedule - no answer; busy signal.

## 2021-03-12 ENCOUNTER — Other Ambulatory Visit (INDEPENDENT_AMBULATORY_CARE_PROVIDER_SITE_OTHER): Payer: Medicaid Other

## 2021-03-12 ENCOUNTER — Ambulatory Visit (INDEPENDENT_AMBULATORY_CARE_PROVIDER_SITE_OTHER): Payer: Medicaid Other | Admitting: Internal Medicine

## 2021-03-12 ENCOUNTER — Encounter: Payer: Self-pay | Admitting: Internal Medicine

## 2021-03-12 VITALS — BP 100/70 | HR 78 | Ht 70.5 in | Wt 139.4 lb

## 2021-03-12 DIAGNOSIS — R109 Unspecified abdominal pain: Secondary | ICD-10-CM | POA: Diagnosis not present

## 2021-03-12 DIAGNOSIS — R933 Abnormal findings on diagnostic imaging of other parts of digestive tract: Secondary | ICD-10-CM

## 2021-03-12 DIAGNOSIS — R1084 Generalized abdominal pain: Secondary | ICD-10-CM

## 2021-03-12 DIAGNOSIS — K5900 Constipation, unspecified: Secondary | ICD-10-CM | POA: Diagnosis not present

## 2021-03-12 LAB — CBC
HCT: 41.3 % (ref 39.0–52.0)
Hemoglobin: 13.7 g/dL (ref 13.0–17.0)
MCHC: 33.3 g/dL (ref 30.0–36.0)
MCV: 84.6 fl (ref 78.0–100.0)
Platelets: 310 10*3/uL (ref 150.0–400.0)
RBC: 4.89 Mil/uL (ref 4.22–5.81)
RDW: 13.9 % (ref 11.5–15.5)
WBC: 10.5 10*3/uL (ref 4.0–10.5)

## 2021-03-12 LAB — COMPREHENSIVE METABOLIC PANEL
ALT: 13 U/L (ref 0–53)
AST: 17 U/L (ref 0–37)
Albumin: 3.8 g/dL (ref 3.5–5.2)
Alkaline Phosphatase: 74 U/L (ref 39–117)
BUN: 13 mg/dL (ref 6–23)
CO2: 29 mEq/L (ref 19–32)
Calcium: 8.9 mg/dL (ref 8.4–10.5)
Chloride: 105 mEq/L (ref 96–112)
Creatinine, Ser: 0.69 mg/dL (ref 0.40–1.50)
GFR: 112.88 mL/min (ref >60.00)
Glucose, Bld: 82 mg/dL (ref 70–99)
Potassium: 4.1 mEq/L (ref 3.5–5.1)
Sodium: 139 mEq/L (ref 135–145)
Total Bilirubin: 0.2 mg/dL (ref 0.2–1.2)
Total Protein: 6.1 g/dL (ref 6.0–8.3)

## 2021-03-12 LAB — C-REACTIVE PROTEIN: CRP: <1 mg/dL (ref 0.5–20.0)

## 2021-03-12 NOTE — Progress Notes (Signed)
   Subjective:    Patient ID: Keith Irwin, male    DOB: 12-25-1975, 45 y.o.   MRN: [id number]  HPI Keith Irwin is a 45 year old male with a prior history of alcohol abuse seen in hospital follow-up.  He is here alone today.  I saw him on 02/22/2021 in the emergency department where he was admitted for abdominal pain, nausea with vomiting, diarrhea and a CT scan suggestive of enteritis.  During his hospitalization he had upper endoscopy and colonoscopy performed by Dr. Christella Hartigan. EGD on 02/23/2021 showed mild inflammation in the stomach which was biopsied.  Biopsies negative for H. pylori but showed mild chronic gastritis Colonoscopy on the same day showed normal terminal ileum which was biopsied.  Slightly erythematous colon from cecum to rectum which was biopsied extensively.  Pathology showed no specific histopathologic changes in either the terminal ileum or colon.  Patient reports that overall he has improved though he still having some mid abdominal pain.  He reports particularly worse at night.  His diarrhea, nausea and vomiting have resolved.  His appetite has returned to normal.  He has had actually more constipation of late and he can skip days without bowel movement.  Bowel movements have been smaller.  No blood in stool or melena  He has remained alcohol abstinent now for nearly 3 months.  Review of Systems As per HPI, otherwise negative  Current Medications, Allergies, Past Medical History, Past Surgical History, Family History and Social History were reviewed in [employer] record.    Objective:   Physical Exam BP 100/70   Pulse 78   Ht 5' 10.5" (1.791 m) Comment: Height measured  Wt 139 lb 6.4 oz (63.2 kg)   SpO2 98%   BMI 19.72 kg/m  Gen: awake, alert, NAD HEENT: anicteric CV: RRR, no mrg Pulm: CTA b/l Abd: soft, NT/ND, +BS throughout Ext: no c/c/e Neuro: nonfocal     Assessment & Plan:  45 year old male with a prior history of alcohol  abuse seen in hospital follow-up.   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  However given ongoing pain I recommended the following --Repeat CT scan abdomen pelvis with contrast --CBC, CMP, TTG with IgA, CRP  2.  Gastritis --improved with pantoprazole, we will continue 40 mg once daily for now  3.  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  Add MiraLAX 17 g daily  4.  Colon cancer screening --would have started next year at age 66 though colonoscopy was normal, would repeat in 10 years (2032)  30 minutes total spent today including patient facing time, coordination of care, reviewing medical history/procedures/pertinent radiology studies, and documentation of the encounter.

## 2021-03-12 NOTE — Patient Instructions (Signed)
Your provider has requested that you go to the basement level for lab work before leaving today. Press "B" on the elevator. The lab is located at the first door on the left as you exit the elevator.  You have been scheduled for a CT scan of the abdomen and pelvis at [facility], 1st floor Radiology. You are scheduled on 03/21/21  at 8:00am. You should arrive 15 minutes prior to your appointment time for registration.  Please pick up 2 bottles of contrast from [place] at least 3 days prior to your scan. The solution may taste better if refrigerated, but do NOT add ice or any other liquid to this solution. Shake well before drinking.   Please follow the written instructions below on the day of your exam:   1) Do not eat anything after 4:00am (4 hours prior to your test)   2) Drink 1 bottle of contrast @ 6:00am (2 hours prior to your exam)  Remember to shake well before drinking and do NOT pour over ice.     Drink 1 bottle of contrast @ 7:00am (1 hour prior to your exam)   You may take any medications as prescribed with a small amount of water, if necessary. If you take any of the following medications: METFORMIN, GLUCOPHAGE, GLUCOVANCE, AVANDAMET, RIOMET, FORTAMET, [place] MET, JANUMET, GLUMETZA or METAGLIP, you MAY be asked to HOLD this medication 48 hours AFTER the exam.   The purpose of you drinking the oral contrast is to aid in the visualization of your intestinal tract. The contrast solution may cause some diarrhea. Depending on your individual set of symptoms, you may also receive an intravenous injection of x-ray contrast/dye. Plan on being at [facility] for 45 minutes or longer, depending on the type of exam you are having performed.   If you have any questions regarding your exam or if you need to reschedule, you may call [facility] at [phone] between the hours of 8:00 am and 5:00 pm, Monday-Friday.   Continue taking your Pantoprazole 25m as directed.    Start Miralax - dissolve 17grams( 1 capful) in at least 8 ounces of water daily.   If you are age 6259or younger, your body mass index should be between 19-25. Your Body mass index is 19.72 kg/m. If this is out of the aformentioned range listed, please consider follow up with your Primary Care Provider.   ________________________________________________________  The [place] [facility] providers would like to encourage you to use [facility] communicate with providers for non-urgent requests or questions.  Due to long hold times on the telephone, sending your provider a message by [facility]may be a faster and more efficient way to get a response.  Please allow 48 business hours for a response.  Please remember that this is for non-urgent requests.   Thank you for choosing me and [facility].  Dr. PHilarie Fredrickson

## 2021-03-13 ENCOUNTER — Encounter: Payer: Self-pay | Admitting: Internal Medicine

## 2021-03-13 LAB — IGA: Immunoglobulin A: 206 mg/dL (ref 47–310)

## 2021-03-13 LAB — TISSUE TRANSGLUTAMINASE, IGA: (tTG) Ab, IgA: <1 U/mL

## 2021-03-21 ENCOUNTER — Telehealth: Payer: Self-pay | Admitting: Internal Medicine

## 2021-03-21 ENCOUNTER — Ambulatory Visit (HOSPITAL_COMMUNITY): Payer: Medicaid Other

## 2021-03-21 NOTE — Telephone Encounter (Signed)
Patient called.  He missed the CT scan this morning as he was sick and wants to have it rescheduled.  Please call patient and advise.  Thank you.

## 2021-04-10 ENCOUNTER — Telehealth: Payer: Self-pay

## 2021-04-10 NOTE — Telephone Encounter (Signed)
Left message for pt to call back to schedule his CT of A/P

## 2021-04-14 NOTE — Telephone Encounter (Signed)
Left message for pt to call back, that he needs to call back asap due to auth running out for CT by the end of the week.

## 2021-04-15 NOTE — Telephone Encounter (Signed)
Called and spoke with sister to see if pt had another number. Patient's sister stated that patient just has one number and we may be having difficulty contacting him because he's at work. Pt's sister said she would try to contact pt and tell him to call the office.

## 2021-11-25 ENCOUNTER — Encounter (HOSPITAL_COMMUNITY): Payer: Self-pay

## 2021-11-25 ENCOUNTER — Other Ambulatory Visit: Payer: Self-pay

## 2021-11-25 ENCOUNTER — Emergency Department (HOSPITAL_COMMUNITY): Payer: Medicaid Other

## 2021-11-25 ENCOUNTER — Emergency Department (HOSPITAL_COMMUNITY)
Admission: EM | Admit: 2021-11-25 | Discharge: 2021-11-25 | Disposition: A | Discharge status: Home / Self Care | Payer: Medicaid Other | Attending: Emergency Medicine | Admitting: Emergency Medicine

## 2021-11-25 DIAGNOSIS — N3001 Acute cystitis with hematuria: Secondary | ICD-10-CM | POA: Insufficient documentation

## 2021-11-25 DIAGNOSIS — R300 Dysuria: Secondary | ICD-10-CM | POA: Diagnosis present

## 2021-11-25 DIAGNOSIS — D72829 Elevated white blood cell count, unspecified: Secondary | ICD-10-CM | POA: Insufficient documentation

## 2021-11-25 DIAGNOSIS — R7309 Other abnormal glucose: Secondary | ICD-10-CM | POA: Diagnosis not present

## 2021-11-25 LAB — URINALYSIS, ROUTINE W REFLEX MICROSCOPIC
Bilirubin Urine: NEGATIVE
Glucose, UA: NEGATIVE mg/dL
Ketones, ur: NEGATIVE mg/dL
Nitrite: NEGATIVE
Protein, ur: 100 mg/dL — AB
RBC / HPF: >50 RBC/hpf — ABNORMAL HIGH (ref 0–5)
Specific Gravity, Urine: 1.016 (ref 1.005–1.030)
WBC, UA: >50 WBC/hpf — ABNORMAL HIGH (ref 0–5)
pH: 8 (ref 5.0–8.0)

## 2021-11-25 LAB — BASIC METABOLIC PANEL
Anion gap: 9 (ref 5–15)
BUN: 8 mg/dL (ref 6–20)
CO2: 26 mmol/L (ref 22–32)
Calcium: 9 mg/dL (ref 8.9–10.3)
Chloride: 107 mmol/L (ref 98–111)
Creatinine, Ser: 0.81 mg/dL (ref 0.61–1.24)
GFR, Estimated: >60 mL/min (ref >60)
Glucose, Bld: 91 mg/dL (ref 70–99)
Potassium: 4 mmol/L (ref 3.5–5.1)
Sodium: 142 mmol/L (ref 135–145)

## 2021-11-25 LAB — CBC WITH DIFFERENTIAL/PLATELET
Abs Immature Granulocytes: 0.08 10*3/uL — ABNORMAL HIGH (ref 0.00–0.07)
Basophils Absolute: 0 10*3/uL (ref 0.0–0.1)
Basophils Relative: 0 %
Eosinophils Absolute: 0.1 10*3/uL (ref 0.0–0.5)
Eosinophils Relative: 1 %
HCT: 43.9 % (ref 39.0–52.0)
Hemoglobin: 14.7 g/dL (ref 13.0–17.0)
Immature Granulocytes: 1 %
Lymphocytes Relative: 13 %
Lymphs Abs: 2 10*3/uL (ref 0.7–4.0)
MCH: 29.9 pg (ref 26.0–34.0)
MCHC: 33.5 g/dL (ref 30.0–36.0)
MCV: 89.4 fL (ref 80.0–100.0)
Monocytes Absolute: 1.8 10*3/uL — ABNORMAL HIGH (ref 0.1–1.0)
Monocytes Relative: 12 %
Neutro Abs: 11.3 10*3/uL — ABNORMAL HIGH (ref 1.7–7.7)
Neutrophils Relative %: 73 %
Platelets: 284 10*3/uL (ref 150–400)
RBC: 4.91 MIL/uL (ref 4.22–5.81)
RDW: 13 % (ref 11.5–15.5)
WBC: 15.3 10*3/uL — ABNORMAL HIGH (ref 4.0–10.5)
nRBC: 0 % (ref 0.0–0.2)

## 2021-11-25 LAB — RAPID HIV SCREEN (HIV 1/2 AB+AG)
HIV 1/2 Antibodies: NONREACTIVE
HIV-1 P24 Antigen - HIV24: NONREACTIVE

## 2021-11-25 LAB — CBG MONITORING, ED: Glucose-Capillary: 93 mg/dL (ref 70–99)

## 2021-11-25 MED ORDER — OXYCODONE-ACETAMINOPHEN 5-325 MG PO TABS: 1.0000 | ORAL_TABLET | Freq: Once | ORAL | Status: DC

## 2021-11-25 MED ORDER — CEPHALEXIN 500 MG PO CAPS: 500.0000 mg | ORAL_CAPSULE | Freq: Four times a day (QID) | ORAL | 0 refills | Status: AC

## 2021-11-25 NOTE — ED Notes (Signed)
Patient verbalizes understanding of discharge instructions. Opportunity for questioning and answers were provided. Armband removed by staff, pt discharged from ED.  

## 2021-11-25 NOTE — ED Triage Notes (Signed)
Pt arrived POV from home c/o hematuria this morning along with pain when he starts to try to finish using the restroom.

## 2021-11-25 NOTE — ED Provider Triage Note (Signed)
Emergency Medicine Provider Triage Evaluation Note  Jerritt Cardoza , a 46 y.o. male  was evaluated in triage.  Pt complains of dysuria and hematuria x1 day.  Also states with right flank pain.  No nausea or vomiting, denies any fevers at home.  No history of kidney stones.  Denies any penile discharge or genital rashes..  Review of Systems  Per HPI  Physical Exam  BP (!) 151/104 (BP Location: Right Arm)   Pulse (!) 51   Temp 99.5 F (37.5 C) (Oral)   Resp 16   Ht 5\' 8"  (1.727 m)   Wt 61.2 kg   SpO2 100%   BMI 20.53 kg/m  Gen:   Awake, no distress   Resp:  Normal effort  MSK:   Moves extremities without difficulty  Other:  Mild right CVA tenderness but no abdominal rigidity or guarding  Medical Decision Making  Medically screening exam initiated at 11:44 AM.  Appropriate orders placed.  Kord Monette was informed that the remainder of the evaluation will be completed by another provider, this initial triage assessment does not replace that evaluation, and the importance of remaining in the ED until their evaluation is complete.     Sigmund Hazel, PA-C 11/25/21 1144

## 2021-11-25 NOTE — Discharge Instructions (Signed)
You are prescribed antibiotics on today's visit in order to help treat your urinary tract infection.  You were also tested for gonorrhea, chlamydia if these results are positive you will need to return for treatment.  Please finish the entire course of antibiotics in order to help resolve your symptoms.

## 2021-11-25 NOTE — ED Provider Notes (Signed)
[facility] EMERGENCY DEPARTMENT Provider Note   CSN: [id number] Arrival date & time: 11/25/21  1021     History  Chief Complaint  Patient presents with   Hematuria   Dysuria    Keith Irwin is a 46 y.o. male.  46 year old male with no past medical history presents to the ED with a chief complaint of dysuria, hematuria sudden onset yesterday.  Patient reports urinating and feel he is unable to completely void, does for seeing some visible hematuria.  He was sexually active however has not been in the past several months.  He does not have any concern for sexually transmitted infection.  There is no alleviating or exacerbating factors.  He does endorse some left CVA tenderness of sudden onset which began today.  Abdominal pain, no fever, no nausea or vomiting.  The history is provided by the patient and medical records.  Hematuria This is a new problem. The current episode started yesterday. The problem occurs constantly. The problem has not changed since onset.Pertinent negatives include no chest pain, no abdominal pain, no headaches and no shortness of breath. Nothing aggravates the symptoms. Nothing relieves the symptoms.  Dysuria Presenting symptoms: dysuria   Associated symptoms: hematuria   Associated symptoms: no abdominal pain, no fever, no nausea and no vomiting        Home Medications Prior to Admission medications   Medication Sig Start Date End Date Taking? Authorizing Provider  cephALEXin (KEFLEX) 500 MG capsule Take 1 capsule (500 mg total) by mouth 4 (four) times daily for 7 days. 11/25/21 12/02/21 Yes Averianna Brugger, Leonie Douglas, PA-C  hydrOXYzine (ATARAX/VISTARIL) 25 MG tablet Take 1 tablet (25 mg total) by mouth every 6 (six) hours as needed for anxiety. 02/24/21   Steffanie Rainwater, MD  nicotine (NICODERM CQ - DOSED IN MG/24 HOURS) 21 mg/24hr patch Place 1 patch (21 mg total) onto the skin daily. 07/28/17   Money, Gerlene Burdock, FNP  pantoprazole (PROTONIX) 40 MG  tablet Take 1 tablet (40 mg total) by mouth daily. 02/24/21   Steffanie Rainwater, MD  traZODone (DESYREL) 50 MG tablet Take 1 tablet (50 mg total) by mouth at bedtime. 02/24/21   Steffanie Rainwater, MD      Allergies    Codeine and Codeine    Review of Systems   Review of Systems  Constitutional:  Negative for chills and fever.  HENT:  Negative for sore throat.   Respiratory:  Negative for shortness of breath.   Cardiovascular:  Negative for chest pain.  Gastrointestinal:  Negative for abdominal pain, nausea and vomiting.  Genitourinary:  Positive for dysuria and hematuria.  Musculoskeletal:  Negative for back pain.  Neurological:  Negative for light-headedness and headaches.  All other systems reviewed and are negative.   Physical Exam Updated Vital Signs BP (!) 151/104 (BP Location: Right Arm)   Pulse (!) 51   Temp 99.5 F (37.5 C) (Oral)   Resp 16   Ht 5\' 8"  (1.727 m)   Wt 61.2 kg   SpO2 100%   BMI 20.53 kg/m  Physical Exam Vitals and nursing note reviewed.  Constitutional:      Appearance: Normal appearance.  HENT:     Head: Normocephalic and atraumatic.     Mouth/Throat:     Mouth: Mucous membranes are moist.  Cardiovascular:     Rate and Rhythm: Normal rate.  Pulmonary:     Effort: Pulmonary effort is normal.  Abdominal:     General: Abdomen is  flat.     Tenderness: There is right CVA tenderness. There is no left CVA tenderness.  Musculoskeletal:     Cervical back: Normal range of motion and neck supple.  Skin:    General: Skin is warm and dry.  Neurological:     Mental Status: He is alert and oriented to person, place, and time.     ED Results / Procedures / Treatments   Labs (all labs ordered are listed, but only abnormal results are displayed) Labs Reviewed  URINALYSIS, ROUTINE W REFLEX MICROSCOPIC - Abnormal; Notable for the following components:      Result Value   APPearance CLOUDY (*)    Hgb urine dipstick MODERATE (*)    Protein, ur 100  (*)    Leukocytes,Ua MODERATE (*)    RBC / HPF >50 (*)    WBC, UA >50 (*)    Bacteria, UA MANY (*)    All other components within normal limits  CBC WITH DIFFERENTIAL/PLATELET - Abnormal; Notable for the following components:   WBC 15.3 (*)    Neutro Abs 11.3 (*)    Monocytes Absolute 1.8 (*)    Abs Immature Granulocytes 0.08 (*)    All other components within normal limits  BASIC METABOLIC PANEL  RAPID HIV SCREEN (HIV 1/2 AB+AG)  CBG MONITORING, ED  GC/CHLAMYDIA PROBE AMP ([place]) NOT AT [facility]    EKG None  Radiology CT Renal Stone Study  Result Date: 11/25/2021 CLINICAL DATA:  Dysuria and hematuria for 1 day. Also with right flank pain. Evaluate for renal stone. EXAM: CT ABDOMEN AND PELVIS WITHOUT CONTRAST TECHNIQUE: Multidetector CT imaging of the abdomen and pelvis was performed following the standard protocol without IV contrast. RADIATION DOSE REDUCTION: This exam was performed according to the departmental dose-optimization program which includes automated exposure control, adjustment of the mA and/or kV according to patient size and/or use of iterative reconstruction technique. COMPARISON:  CT abdomen pelvis 02/22/2021 FINDINGS: Lower chest: No acute abnormality. Evaluation of the abdominal viscera is limited by lack of IV contrast. Hepatobiliary: No focal liver abnormality is seen. Normal gallbladder. Pancreas: Unremarkable. No surrounding inflammatory changes. Spleen: Normal in size without focal abnormality. Adrenals/Urinary Tract: Adrenal glands are unremarkable. Kidneys are normal, without renal calculi, focal lesion, or hydronephrosis. Urinary bladder appears mildly thick walled. Stomach/Bowel: Stomach is within normal limits. Appendix appears normal. No evidence of bowel wall thickening, distention, or inflammatory changes. Vascular/Lymphatic: Aortic atherosclerosis. No enlarged abdominal or pelvic lymph nodes. Reproductive: Prostate is unremarkable. Other: No abdominal wall  hernia or abnormality. No abdominopelvic ascites. Musculoskeletal: No acute or significant osseous findings. IMPRESSION: 1. No renal or ureteral calculi. 2. Urinary bladder appears mildly thick walled, which may represent cystitis. Recommend correlation with urinalysis. Aortic Atherosclerosis (ICD10-I70.0). Electronically Signed   By: Emmaline Kluver M.D.   On: 11/25/2021 13:11    Procedures Procedures    Medications Ordered in ED Medications  oxyCODONE-acetaminophen (PERCOCET/ROXICET) 5-325 MG per tablet 1 tablet (has no administration in time range)    ED Course/ Medical Decision Making/ A&P Clinical Course as of 11/25/21 1511  Tue Nov 25, 2021  1409 WBC, UA(!): >50 [JS]  1409 Glori Luis): MODERATE [JS]  1409 Bacteria, UA(!): MANY [JS]    Clinical Course User Index [JS] Claude Manges, PA-C                           Medical Decision Making Amount and/or Complexity of Data Reviewed Labs:  Decision-making  details documented in ED Course.  Risk Prescription drug management.     This patient presents to the ED for concern of dysuria, this involves a number of treatment options, and is a complaint that carries with it a high risk of complications and morbidity.  The differential diagnosis includes STD, versus cystitis versus renal colic.   Co morbidities: Discussed in HPI   Brief History:  Presents to the ED with right flank pain that began yesterday along with some dysuria and hematuria.  No MAT.  No prior incidence of these, no fevers nausea or vomiting.  Does report sexual activity however has not had so in several months.  EMR reviewed including pt PMHx, past surgical history and past visits to ER.   See HPI for more details   Lab Tests:  I ordered and independently interpreted labs.  The pertinent results include:    Labs notable for evaded leukocytosis of 15.3, this is not chronic prior levels are within normal limits.  BMP with no electrolyte derangement,  creatinine levels within normal limits.  UA with many bacteria, moderate leukocytes and greater than 50 white blood cell count, consistent with urinary tract infection.   Imaging Studies:  Ct Renal: 1. No renal or ureteral calculi.  2. Urinary bladder appears mildly thick walled, which may represent  cystitis. Recommend correlation with urinalysis.   Medicines ordered:  I ordered medication including percocet  for pain control Reevaluation of the patient after these medicines showed that the patient stayed the same I have reviewed the patients home medicines and have made adjustments as needed   Reevaluation:  After the interventions noted above I re-evaluated patient and found that they have :stayed the same   Social Determinants of Health:  The patient's social determinants of health were a factor in the care of this patient    Problem List / ED Course:  Patient here with hematuria, dysuria that is been ongoing since yesterday, also experienced some CVA tenderness.  Reports sexual activity a few months back but has not done so recently.  Has not taken anything for symptomatic improvement.  Labs are reassuring, creatinine level slightly elevated however the rest of them are within normal limits.  CT did not show any stone however consistent with cystitis.  UA with cystitis, I did discuss with him GC testing along with HIV testing.  He is agreeable of plan and treatment.  Also given Percocet while in the ED to help with pain control.NO fever, nausea or vomiting to suggest pyelonephritis.    Dispostion:  After consideration of the diagnostic results and the patients response to treatment, I feel that the patent would benefit from outpatient treatment of uncomplicated UTI with Keflex 500 mg every 4.  I discussed with him returning if symptoms do worsen.        Portions of this note were generated with Scientist, clinical (histocompatibility and immunogenetics). Dictation errors may occur despite best attempts at  proofreading.  \ Final Clinical Impression(s) / ED Diagnoses Final diagnoses:  Acute cystitis with hematuria    Rx / DC Orders ED Discharge Orders          Ordered    cephALEXin (KEFLEX) 500 MG capsule  4 times daily        11/25/21 1510              Claude Manges, PA-C 11/25/21 1511    Kneller, Trosky K, DO 11/25/21 1559

## 2021-11-25 NOTE — ED Notes (Signed)
Ozella Rocks (Mother) called asking for and update in [place]. Her number is [phone].

## 2021-11-26 LAB — GC/CHLAMYDIA PROBE AMP (~~LOC~~) NOT AT ARMC
Chlamydia: NEGATIVE
Comment: NEGATIVE
Comment: NORMAL
Neisseria Gonorrhea: NEGATIVE

## 2023-07-25 IMAGING — CT CT ABD-PELV W/ CM
2 of 5 series · 15 of 46 positions shown, 17 images · IV contrast (APPLIED)
Comparison: CT with IV contrast 12/06/2020 and 04/01/2014, and the
report of a study from 10/21/2015 which images could not be
retrieved.

CLINICAL DATA: Acute abdominal pain.

EXAM:
CT ABDOMEN AND PELVIS WITH CONTRAST
TECHNIQUE: Multidetector CT imaging of the abdomen and pelvis was performed
using the standard protocol following bolus administration of
intravenous contrast.
CONTRAST:  75mL OMNIPAQUE IOHEXOL 300 MG/ML  SOLN

[Series 3: abdomen 5.0 · axial · 0.76mm/px · z∈[+881,+1261]mm · 12 of 90 slices shown, 14 images]
[im 7/90  soft-tissue]
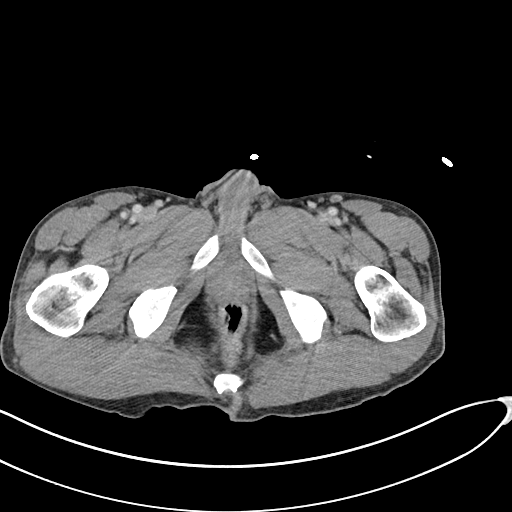
[im 7/90  bone]
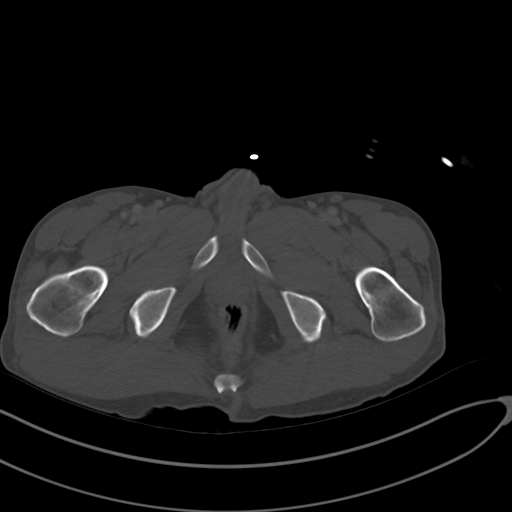
[im 13/90  soft-tissue]
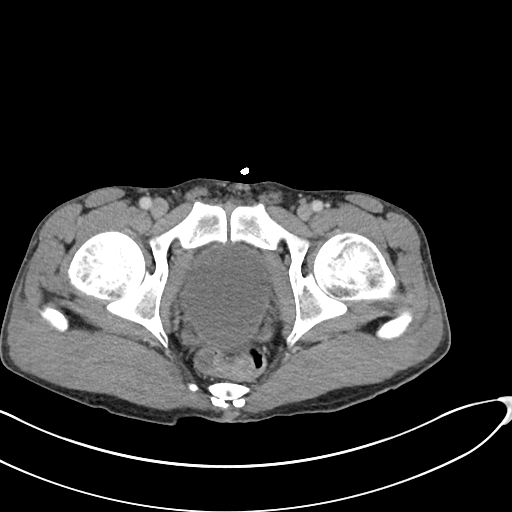
[im 20/90  soft-tissue]
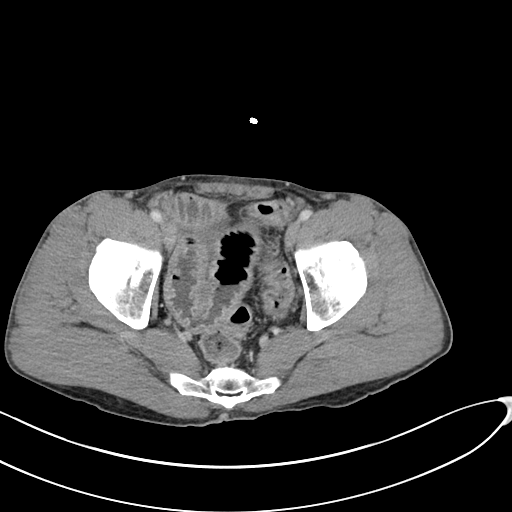
[im 26/90  soft-tissue]
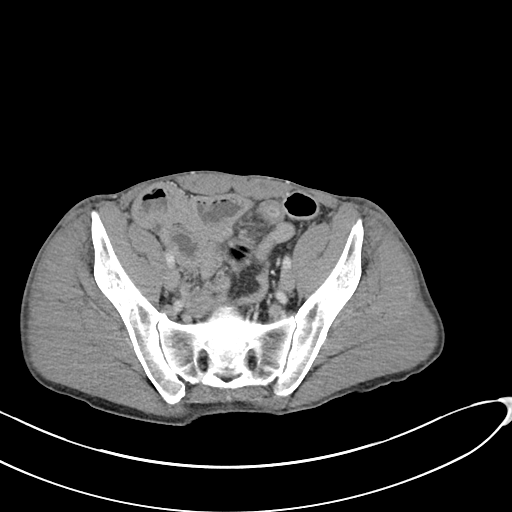
[im 32/90  soft-tissue]
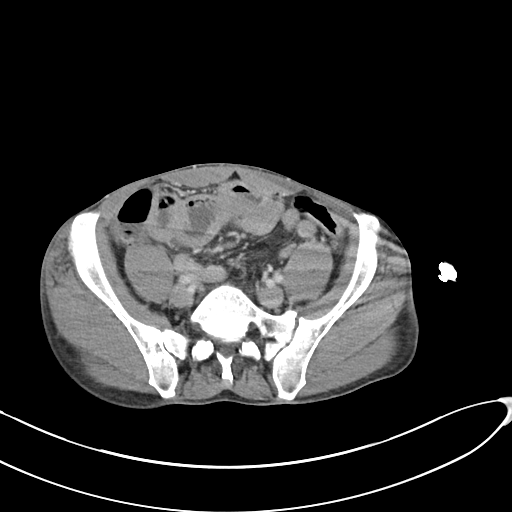
[im 39/90  soft-tissue]
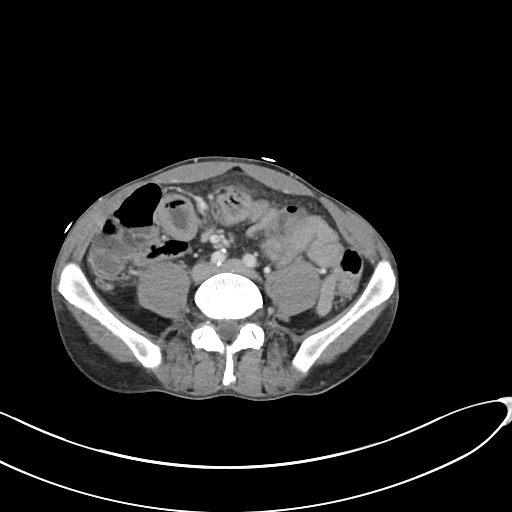
[im 51/90  soft-tissue]
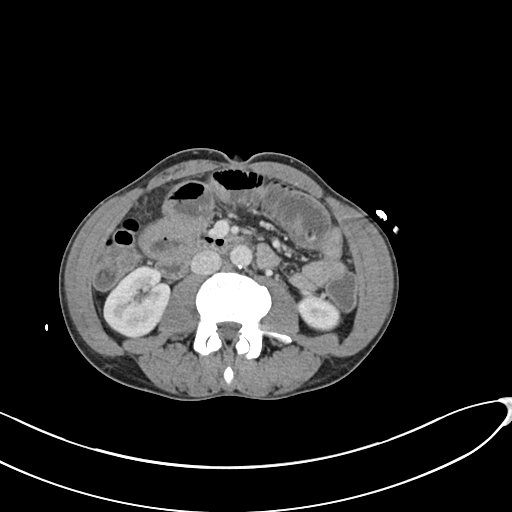
[im 58/90  soft-tissue]
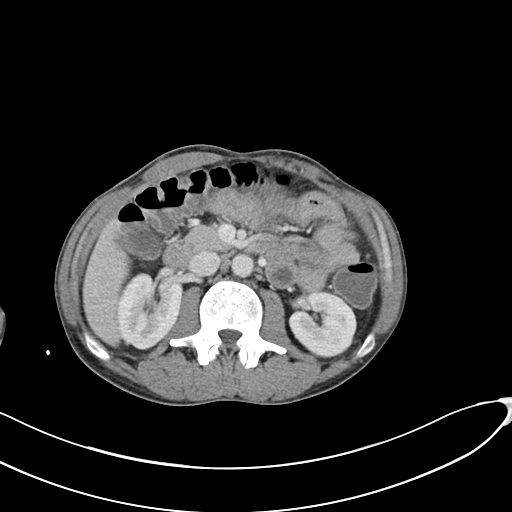
[im 64/90  soft-tissue]
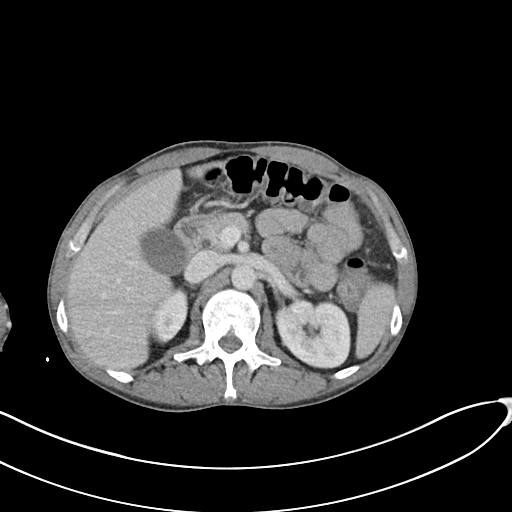
[im 64/90  bone]
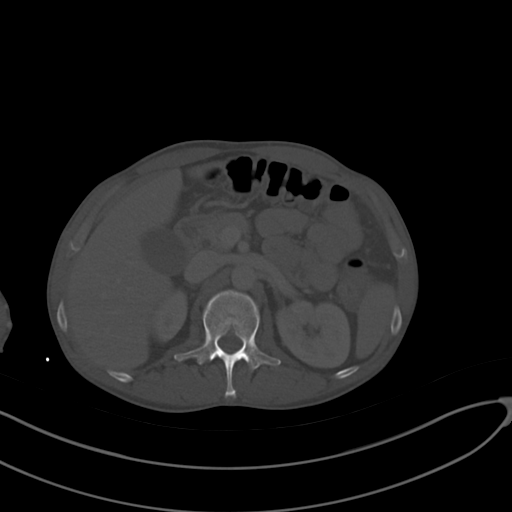
[im 70/90  soft-tissue]
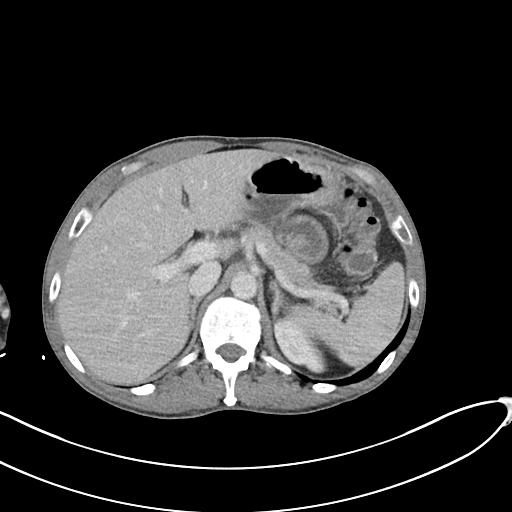
[im 77/90  soft-tissue]
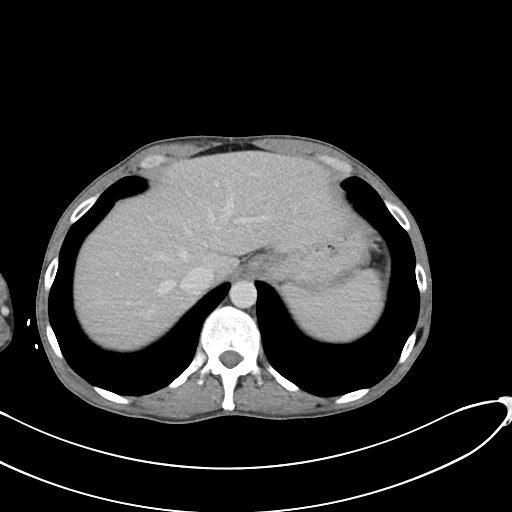
[im 83/90  soft-tissue]
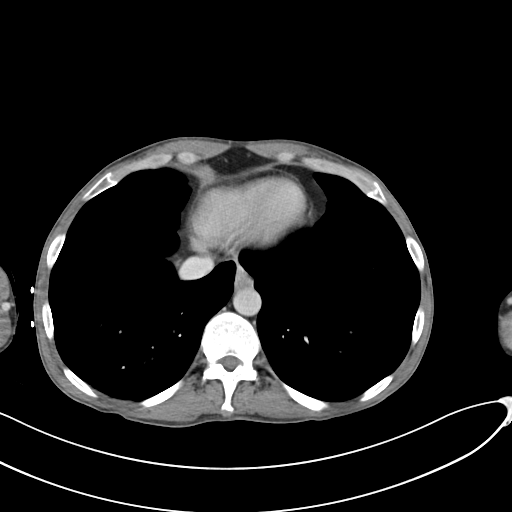

[Series 6: abdomen 3.0 mpr cor · coronal · 0.67mm/px · 3 of 76 slices shown]
[im 26/76  soft-tissue]
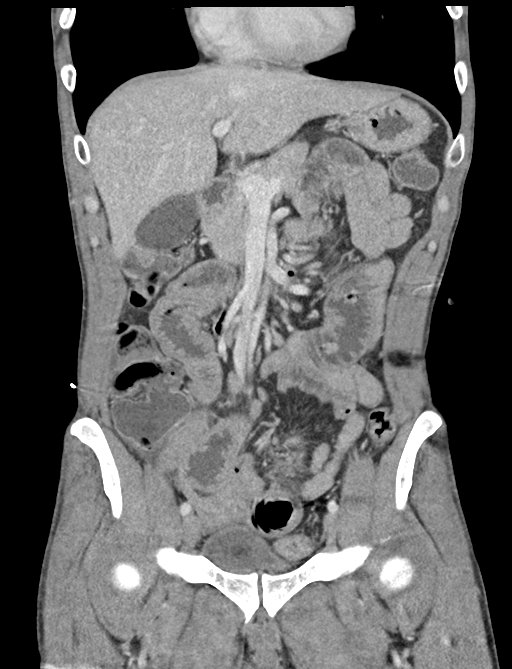
[im 34/76  soft-tissue]
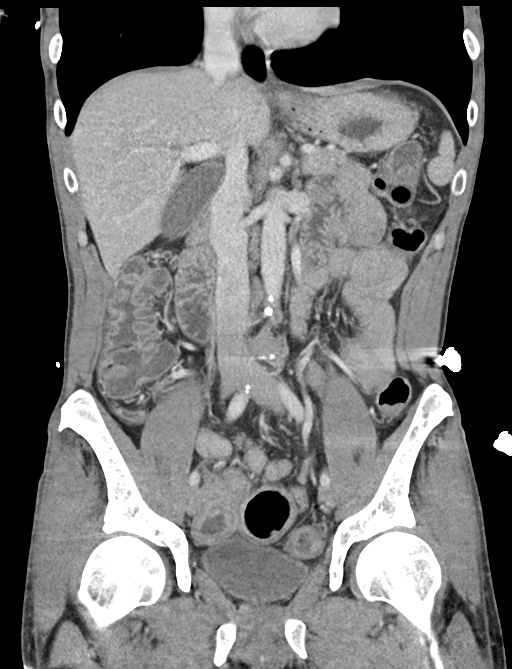
[im 42/76  soft-tissue]
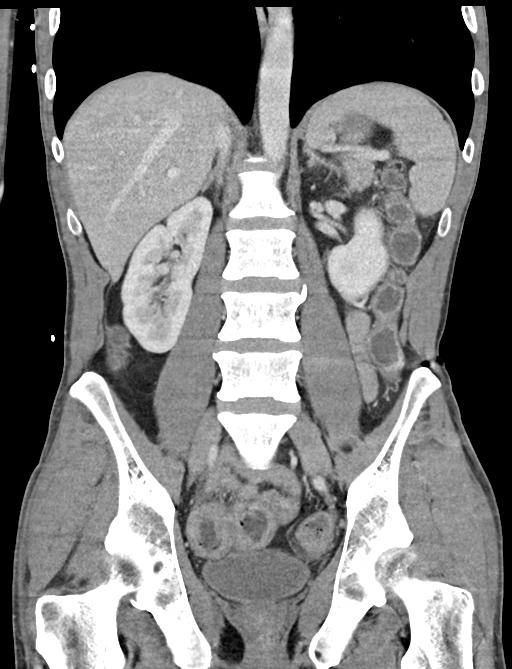

[15 of 46 positions shown; findings below may reference images not displayed]

FINDINGS: Lower chest: No acute abnormality.

Hepatobiliary: The liver is slightly steatotic but less so than on
12/06/2020. No focal lesion is seen.

Pancreas: Normal.

Spleen: Normal.

Adrenals/Urinary Tract: There is no adrenal mass. There is no renal
mass enhancement, calculus or hydronephrosis. The bladder wall is
unremarkable.

Stomach/Bowel: There is increasing fold thickening in the stomach
and in the jejunum. In the mid and lower abdomen there are
increasingly dilated and thickened small bowel segments, with the
distal ileum unremarkable and small bowel dilatation in the mid and
lower abdomen up to 4 cm.

There also is fluid in the colon and slight enhancement of the colon
wall without inflammatory change. There is mesenteric haziness in
the lower abdomen associated with the thickened dilated small bowel
segments. The transition to decompressed distal ileum caliber could
not be found.

Vascular/Lymphatic: There are increased slightly prominent lymph
nodes along the mesenteric root. Largest of these is 1 cm in short
axis. There is no further adenopathy. Aortic atherosclerosis.

Reproductive: Normal prostate.

Other: There is no bowel pneumatosis. There is trace ascites in the
pelvis. There is no free air, hemorrhage or abscess.Small umbilical
fat hernia.

Musculoskeletal: No acute or significant osseous findings.
IMPRESSION: 1. Compared with 12/06/2020, there are increased findings of
gastritis and upper abdominal enteritis. The upper abdominal small
bowel is normal caliber but there are also increasingly thickened
and dilated ileal segments in the mid and lower abdomen with distal
ileal sparing and decompression, and evidence of at least
intermediate grade partial small bowel obstruction with transitional
segment not identified. There are mesenteric congestive/inflammatory
features in the lower abdomen associated with this but no bowel
pneumatosis. The wall thickening could be due to infectious or
inflammatory enteritis and/or ischemic enteropathy, but there is
normal opacification of the SMA and SMV and branch vessels.
Infiltrating wall disease is possible but should not have become
this prominent in just 2 months. There is also evidence of at least
a mild colitis, with fluid throughout the colon.
2. Minimal pelvic ascites.
3. Development of mildly enlarged mesenteric root lymph nodes likely
reactive. There is no bulky or encasing adenopathy.
4. Improved steatosis of the liver since 12/06/2020.

## 2024-05-04 ENCOUNTER — Emergency Department (HOSPITAL_BASED_OUTPATIENT_CLINIC_OR_DEPARTMENT_OTHER)
Admission: EM | Admit: 2024-05-04 | Discharge: 2024-05-04 | Disposition: A | Discharge status: Home / Self Care | Attending: Emergency Medicine | Admitting: Emergency Medicine

## 2024-05-04 ENCOUNTER — Other Ambulatory Visit: Payer: Self-pay

## 2024-05-04 ENCOUNTER — Emergency Department (HOSPITAL_BASED_OUTPATIENT_CLINIC_OR_DEPARTMENT_OTHER)

## 2024-05-04 ENCOUNTER — Encounter (HOSPITAL_BASED_OUTPATIENT_CLINIC_OR_DEPARTMENT_OTHER): Payer: Self-pay

## 2024-05-04 DIAGNOSIS — M7051 Other bursitis of knee, right knee: Secondary | ICD-10-CM | POA: Insufficient documentation

## 2024-05-04 DIAGNOSIS — Y9389 Activity, other specified: Secondary | ICD-10-CM | POA: Insufficient documentation

## 2024-05-04 DIAGNOSIS — D72829 Elevated white blood cell count, unspecified: Secondary | ICD-10-CM | POA: Insufficient documentation

## 2024-05-04 DIAGNOSIS — R2241 Localized swelling, mass and lump, right lower limb: Secondary | ICD-10-CM | POA: Diagnosis present

## 2024-05-04 LAB — CBC WITH DIFFERENTIAL/PLATELET
Abs Immature Granulocytes: 0.05 10*3/uL (ref 0.00–0.07)
Basophils Absolute: 0 10*3/uL (ref 0.0–0.1)
Basophils Relative: 0 %
Eosinophils Absolute: 0.1 10*3/uL (ref 0.0–0.5)
Eosinophils Relative: 1 %
HCT: 42.1 % (ref 39.0–52.0)
Hemoglobin: 14.6 g/dL (ref 13.0–17.0)
Immature Granulocytes: 0 %
Lymphocytes Relative: 23 %
Lymphs Abs: 2.7 10*3/uL (ref 0.7–4.0)
MCH: 30.2 pg (ref 26.0–34.0)
MCHC: 34.7 g/dL (ref 30.0–36.0)
MCV: 87.2 fL (ref 80.0–100.0)
Monocytes Absolute: 1.4 10*3/uL — ABNORMAL HIGH (ref 0.1–1.0)
Monocytes Relative: 12 %
Neutro Abs: 7.5 10*3/uL (ref 1.7–7.7)
Neutrophils Relative %: 64 %
Platelets: 264 10*3/uL (ref 150–400)
RBC: 4.83 MIL/uL (ref 4.22–5.81)
RDW: 13.5 % (ref 11.5–15.5)
WBC: 11.8 10*3/uL — ABNORMAL HIGH (ref 4.0–10.5)
nRBC: 0 % (ref 0.0–0.2)

## 2024-05-04 LAB — COMPREHENSIVE METABOLIC PANEL WITH GFR
ALT: 26 U/L (ref 0–44)
AST: 28 U/L (ref 15–41)
Albumin: 5 g/dL (ref 3.5–5.0)
Alkaline Phosphatase: 90 U/L (ref 38–126)
Anion gap: 14 (ref 5–15)
BUN: 9 mg/dL (ref 6–20)
CO2: 24 mmol/L (ref 22–32)
Calcium: 10 mg/dL (ref 8.9–10.3)
Chloride: 101 mmol/L (ref 98–111)
Creatinine, Ser: 0.8 mg/dL (ref 0.61–1.24)
GFR, Estimated: >60 mL/min
Glucose, Bld: 98 mg/dL (ref 70–99)
Potassium: 3.8 mmol/L (ref 3.5–5.1)
Sodium: 138 mmol/L (ref 135–145)
Total Bilirubin: 0.3 mg/dL (ref 0.0–1.2)
Total Protein: 8 g/dL (ref 6.5–8.1)

## 2024-05-04 MED ORDER — PREDNISONE 50 MG PO TABS
60.0000 mg | ORAL_TABLET | Freq: Once | ORAL | Status: AC
  Administered 2024-05-04: 60 mg via ORAL
  Filled 2024-05-04: qty 1

## 2024-05-04 MED ORDER — DOXYCYCLINE HYCLATE 100 MG PO CAPS: 100.0000 mg | ORAL_CAPSULE | Freq: Two times a day (BID) | ORAL | 0 refills | Status: AC

## 2024-05-04 MED ORDER — KETOROLAC TROMETHAMINE 30 MG/ML IJ SOLN
30.0000 mg | Freq: Once | INTRAMUSCULAR | Status: AC
  Administered 2024-05-04: 30 mg via INTRAMUSCULAR
  Filled 2024-05-04: qty 1

## 2024-05-04 MED ORDER — DOXYCYCLINE HYCLATE 100 MG PO TABS
100.0000 mg | ORAL_TABLET | Freq: Once | ORAL | Status: AC
  Administered 2024-05-04: 100 mg via ORAL
  Filled 2024-05-04: qty 1

## 2024-05-04 MED ORDER — PREDNISONE 20 MG PO TABS: 40.0000 mg | ORAL_TABLET | Freq: Every day | ORAL | 0 refills | Status: AC

## 2024-05-04 NOTE — ED Provider Notes (Signed)
 " [place] EMERGENCY DEPARTMENT AT [facility] Provider Note   CSN: [id number] Arrival date & time: 05/04/24  1739     Patient presents with: Leg Swelling   Keith Irwin is a 49 y.o. male.   Patient is a 49 year old male with a history of alcohol abuse and steatosis of the liver who is presenting today with complaints of knee and shin pain.  He reports that he does a very physical job where he is on his knees a lot but the pain started yesterday.  It was not associated with a job where he was down on his knees a lot.  However he has had significant pain in the shin with some redness and swelling.  He also reports there is a little bit of pain in the bottom part of his knee.  No pain with bending the knee or walking.  However when he tried to get down on his knees today to do his work it was too uncomfortable to continue.  He denies any known trauma.  No fevers.  The history is provided by the patient.       Prior to Admission medications  Medication Sig Start Date End Date Taking? Authorizing Provider  doxycycline  (VIBRAMYCIN ) 100 MG capsule Take 1 capsule (100 mg total) by mouth 2 (two) times daily. 05/04/24  Yes Doretha Folks, MD  predniSONE  (DELTASONE ) 20 MG tablet Take 2 tablets (40 mg total) by mouth daily. 05/04/24  Yes Doretha Folks, MD  hydrOXYzine  (ATARAX /VISTARIL ) 25 MG tablet Take 1 tablet (25 mg total) by mouth every 6 (six) hours as needed for anxiety. 02/24/21   Amponsah, Prosper M, MD  nicotine  (NICODERM CQ  - DOSED IN MG/24 HOURS) 21 mg/24hr patch Place 1 patch (21 mg total) onto the skin daily. 07/28/17   Money, Caron NOVAK, FNP  pantoprazole  (PROTONIX ) 40 MG tablet Take 1 tablet (40 mg total) by mouth daily. 02/24/21   Lou Claretta HERO, MD  traZODone  (DESYREL ) 50 MG tablet Take 1 tablet (50 mg total) by mouth at bedtime. 02/24/21   Lou Claretta HERO, MD    Allergies: Codeine and Codeine    Review of Systems  Updated Vital Signs BP 139/81   Pulse  (!) 57   Temp 97.9 F (36.6 C)   Resp 16   SpO2 92%   Physical Exam Vitals and nursing note reviewed.  Constitutional:      General: He is not in acute distress.    Appearance: He is well-developed.  HENT:     Head: Normocephalic and atraumatic.  Eyes:     Conjunctiva/sclera: Conjunctivae normal.     Pupils: Pupils are equal, round, and reactive to light.  Cardiovascular:     Rate and Rhythm: Normal rate and regular rhythm.     Pulses: Normal pulses.     Heart sounds: No murmur heard. Pulmonary:     Effort: Pulmonary effort is normal. No respiratory distress.     Breath sounds: Normal breath sounds. No wheezing or rales.  Abdominal:     General: There is no distension.     Palpations: Abdomen is soft.     Tenderness: There is no abdominal tenderness. There is no guarding or rebound.  Musculoskeletal:        General: Tenderness present. Normal range of motion.     Cervical back: Normal range of motion and neck supple.       Legs:     Comments: Mild swelling noted over the tibial tuberosity on the  right.  No significant pain.  Full flexion extension of the knee without any pain.  Skin:    General: Skin is warm and dry.     Findings: No erythema or rash.  Neurological:     Mental Status: He is alert and oriented to person, place, and time.  Psychiatric:        Behavior: Behavior normal.     (all labs ordered are listed, but only abnormal results are displayed) Labs Reviewed  CBC WITH DIFFERENTIAL/PLATELET - Abnormal; Notable for the following components:      Result Value   WBC 11.8 (*)    Monocytes Absolute 1.4 (*)    All other components within normal limits  COMPREHENSIVE METABOLIC PANEL WITH GFR    EKG: None  Radiology: DG Knee Complete 4 Views Right Result Date: 05/04/2024 EXAM: 4 VIEW(S) XRAY OF THE RIGHT KNEE 05/04/2024 09:53:00 PM COMPARISON: None available. CLINICAL HISTORY: Pain and swelling. FINDINGS: BONES AND JOINTS: No acute fracture. No  malalignment. No significant joint effusion. No significant degenerative changes. SOFT TISSUES: Mild infrapatellar soft tissue swelling. IMPRESSION: 1. Mild infrapatellar soft tissue swelling. Electronically signed by: Oneil Devonshire MD 05/04/2024 09:59 PM EST RP Workstation: [id number]   US  Venous Img Lower Unilateral Right Result Date: 05/04/2024 EXAM: ULTRASOUND DUPLEX OF THE RIGHT LOWER EXTREMITY VEINS TECHNIQUE: Duplex ultrasound using B-mode/gray scaled imaging and Doppler spectral analysis and color flow was obtained of the deep venous structures of the right lower extremity. COMPARISON: None available. CLINICAL HISTORY: Right lower extremity pain and swelling. FINDINGS: The common femoral vein, femoral vein, popliteal vein, and posterior tibial vein demonstrate normal compressibility with normal color flow and spectral analysis. IMPRESSION: 1. No evidence of deep venous thrombosis in the right lower extremity. Electronically signed by: Oneil Devonshire MD 05/04/2024 07:43 PM EST RP Workstation: [id number]     Procedures   Medications Ordered in the ED  doxycycline  (VIBRA -TABS) tablet 100 mg (has no administration in time range)  predniSONE  (DELTASONE ) tablet 60 mg (has no administration in time range)  ketorolac  (TORADOL ) 30 MG/ML injection 30 mg (30 mg Intramuscular Given 05/04/24 2133)                                    Medical Decision Making Amount and/or Complexity of Data Reviewed Labs: ordered. Decision-making details documented in ED Course. Radiology: ordered and independent interpretation performed. Decision-making details documented in ED Course.  Risk Prescription drug management.   Patient presenting today with the above complaint.  He has no calf pain and normal pulses.  Low suspicion for arterial abnormalities and will rule out DVT.  Also concern for possible bursitis versus early cellulitis.  Patient does not have significant knee pain and has full flexion extension with low  suspicion for septic joint.  I independently interpreted patient's labs and CBC with minimal leukocytosis of 11 but normal BMP.  I have independently visualized and interpreted pt's images today.  DVT study does not show signs of DVT based on my review and radiology's report x-ray shows no evidence of bony abnormality.  Radiology does report mild infrapatellar soft tissue swelling.  Discussed all the findings with the patient.  Could be possible early bursitis versus cellulitis.  Will treat with doxycycline  and prednisone .  Patient was given return precautions.      Final diagnoses:  Localized swelling of right lower extremity  Infrapatellar bursitis of right knee  ED Discharge Orders          Ordered    doxycycline  (VIBRAMYCIN ) 100 MG capsule  2 times daily        05/04/24 2210    predniSONE  (DELTASONE ) 20 MG tablet  Daily        05/04/24 2210               Doretha Folks, MD 05/04/24 2211  "

## 2024-05-04 NOTE — Discharge Instructions (Addendum)
 In addition to the steroids and antibiotic you can take ibuprofen  and Tylenol  as needed for pain.  Elevate your leg as often as you can and avoid putting weight on your knees.  You can take your next dose of antibiotics in the morning.  Return to the emergency room if you start having worsening symptoms and develop more redness in the leg

## 2024-05-04 NOTE — ED Triage Notes (Signed)
 Pt c/o R leg swelling since last night. Advises RLE is warm, painful to touch & when bearing weight in a certain way.  Denies fevers, SHOB/ CP, injury No meds PTA

## 2024-05-04 NOTE — Progress Notes (Signed)
 Subjective  Previsit planning was completed via snapshot and review of chart.   Keith Irwin is a 49 y.o. male who presents for Leg Pain (Pt complains of right leg swelling and pain, since last night. Right lower leg looks swollen . )   History of Present Illness This is a 49 year old male presenting with right leg swelling.  The patient reports significant swelling in his right leg, which he attributes to fluid accumulation. The onset of the swelling was yesterday, and it has since worsened. He describes a sensation of warmth around the affected area but does not experience any severe pain upon palpation. He works on his knees and has been using knee pads, which he finds irritating since onset of leg pain. He reports no systemic symptoms such as fever, chills, nausea, or vomiting. No CP or ShoB. He also reports no numbness or tingling in the leg. He has no history of cancer, recent illness, or prolonged bed rest due to surgery. He has varicose veins in his arms and hands but not in his legs. He has no history of injury to the leg in the past few weeks and has never had a blood clot. He reports no tenderness in the back of the leg but notes that the front of the leg is tender. He does not experience pain during ambulation or movement but feels tension when walking. He expresses concern about the swelling due to his mother's history of leg swelling and fluid accumulation. He reports no fever or chills, but feels unwell. He has a history of knee and leg issues, including fractures to his shin and both knees. He has previously experienced fluid accumulation on his kneecap, which subsequently drained.  FAMILY HISTORY His mother has leg swelling and fluid accumulation.  Medical records reviewed. Pt hx significant for tobacco abuse Score of 1 on DVT Wells criteria.  This is in the moderate risk range.  Medical History[1]  Allergies[2]   Surgical History[3]  Family History[4]  Social  History   Socioeconomic History   Marital status: Single    Spouse name: Not on file   Number of children: Not on file   Years of education: Not on file   Highest education level: Not on file  Occupational History   Not on file  Tobacco Use   Smoking status: Every Day   Smokeless tobacco: Not on file  Substance and Sexual Activity   Alcohol use: Yes   Drug use: Yes    Types: Marijuana   Sexual activity: Not on file  Other Topics Concern   Not on file  Social History Narrative   Not on file   Social Drivers of Health   Living Situation: Not on file  Food Insecurity: Not on file  Transportation Needs: Not on file  Utilities: Not on file  Safety: Low Risk (05/04/2024)   Safety    How often does anyone, including family and friends, physically hurt you?: Never    How often does anyone, including family and friends, insult or talk down to you?: Never    How often does anyone, including family and friends, threaten you with harm?: Never    How often does anyone, including family and friends, scream or curse at you?: Never  Alcohol Screening: Not on file  Tobacco Use: High Risk (05/04/2024)   Patient History    Smoking Tobacco Use: Every Day    Smokeless Tobacco Use: Unknown    Passive Exposure: Not on file  Depression:  Not At Risk (05/04/2024)   PHQ-2    PHQ-2 Score: 0  Social Connections: Not on file  Financial Resource Strain: Not on file     Encounter Medications[5]   Review of Systems - All systems reviewed and are negative except what is noted in the HPI.  Objective  Blood pressure 139/87, pulse 60, temperature 98.6 F (37 C), temperature source Oral, resp. rate 18, height 1.727 m (5' 8), weight 63.5 kg (140 lb), SpO2 99%. Physical Exam Musculoskeletal: Tenderness noted at the top and bottom of the kneecap on the right leg. No tenderness in the back of the leg. No numbness or tingling reported. Integument/Skin: Right leg shows swelling and  erythema from the bottom of the kneecap down to the ankle. The area is warm to touch. Physical Exam Vitals and nursing note reviewed.  Constitutional:      General: He is not in acute distress.    Appearance: Normal appearance. He is not ill-appearing or toxic-appearing.  HENT:     Head: Normocephalic and atraumatic.     Right Ear: External ear normal.     Left Ear: External ear normal.     Nose: Nose normal.     Mouth/Throat:     Mouth: Mucous membranes are moist.     Pharynx: Oropharynx is clear.  Eyes:     Conjunctiva/sclera: Conjunctivae normal.     Pupils: Pupils are equal, round, and reactive to light.  Cardiovascular:     Rate and Rhythm: Normal rate and regular rhythm.     Pulses: Normal pulses.     Heart sounds: Normal heart sounds. No murmur heard. Pulmonary:     Effort: Pulmonary effort is normal. No respiratory distress.     Breath sounds: No stridor.  Abdominal:     General: There is no distension.     Palpations: Abdomen is soft.  Musculoskeletal:        General: Swelling and tenderness present. Normal range of motion.     Cervical back: Normal range of motion and neck supple.     Comments: Musculoskeletal: Tenderness noted at the top and bottom of the patella on the right leg. No tenderness in the back of the leg.  Integument/Skin: Right leg shows pretibial swelling and erythema from the inferior aspect of the patella down to the ankle. The area is warm to touch.  Tenderness to palpation.  Skin:    General: Skin is warm and dry.     Capillary Refill: Capillary refill takes less than 2 seconds.     Findings: Erythema present. No rash.  Neurological:     General: No focal deficit present.     Mental Status: He is alert and oriented to person, place, and time.  Psychiatric:        Mood and Affect: Mood normal.        Behavior: Behavior normal.      Lab Results Results  No results found for this or any previous visit (from the past 24 hours).  Imaging  result: NA If imaging obtained during visit, radiologist will also review. No orders to display     Assessment/Plan MDM   1. Right leg pain      2. Leg edema, right      3. Cellulitis of anterior lower leg        Assessment & Plan Initial Assessment: 49 year old male with right lower extremity edema, warmth, and redness. Symptoms started yesterday, worsened today. No fever, chills, nausea, vomiting, numbness,  or tingling. History of knee injuries but no prior similar fluid accumulation.  Differential Diagnosis: - Cellulitis: Warmth and redness concerning for infection. Referral for further evaluation and antibiotics. - Deep Vein Thrombosis (DVT): Wells criteria score of 1, moderate risk. Referral for imaging to rule out DVT. Differentials also include, but are not limited to prepatellar bursitis, retained foreign body, cellulitis, contact dermatitis, contusion, tendinitis  Urgent Care Course: - Referral to [facility] for further evaluation and management.  Final Assessment: Referral to [facility] for further evaluation of right lower extremity edema, warmth, and redness. Imaging and labs to rule out DVT and confirm cellulitis. Antibiotics likely needed.  Clinical Impression: - Cellulitis pretibial aspect right lower extremity - Moderate risk for Deep Vein Thrombosis (DVT) - Tobacco abuse  Disposition: - Transfer: Referral to [facility] for further evaluation and management.  Patient Education: Discussed cellulitis and DVT. Explained need for further evaluation and potential treatment with antibiotics.   Medical records from outside the [facility] system also reviewed during today's clinical visit.  Julienne Annette Summerlin   [employer] was used to create visit note. Consent from the patient/caregiver was obtained prior to its use.        [1] History reviewed. No pertinent past medical history. [2] Allergies Allergen Reactions   Codeine  Itching and Rash  [3] History reviewed. No pertinent surgical history. [4] No family history on file. [5] Outpatient Encounter Medications as of 05/04/2024  Medication Sig Dispense Refill   HYDROcodone -acetaminophen  (NORCO) 10-325 mg per tablet  (Patient not taking: Reported on 05/04/2024)  0   tobramycin -dexamethasone  (Tobradex ) 0.3-0.1 % oint ophthalmic ointment  (Patient not taking: Reported on 05/04/2024)  0   traMADoL (ULTRAM) 50 mg tablet Take 50 mg by mouth every 8 (eight) hours as needed (pain). (Patient not taking: Reported on 05/04/2024) 30 tablet 0   No facility-administered encounter medications on file as of 05/04/2024.

## 2024-05-10 ENCOUNTER — Emergency Department (HOSPITAL_BASED_OUTPATIENT_CLINIC_OR_DEPARTMENT_OTHER)
Admission: EM | Admit: 2024-05-10 | Discharge: 2024-05-10 | Disposition: A | Discharge status: Home / Self Care | Attending: Emergency Medicine | Admitting: Emergency Medicine

## 2024-05-10 ENCOUNTER — Other Ambulatory Visit: Payer: Self-pay

## 2024-05-10 ENCOUNTER — Encounter (HOSPITAL_BASED_OUTPATIENT_CLINIC_OR_DEPARTMENT_OTHER): Payer: Self-pay | Admitting: Emergency Medicine

## 2024-05-10 DIAGNOSIS — M79661 Pain in right lower leg: Secondary | ICD-10-CM | POA: Insufficient documentation

## 2024-05-10 DIAGNOSIS — R600 Localized edema: Secondary | ICD-10-CM | POA: Insufficient documentation

## 2024-05-10 MED ORDER — MELOXICAM 7.5 MG PO TABS: 7.5000 mg | ORAL_TABLET | Freq: Every day | ORAL | 0 refills | Status: AC

## 2024-05-10 NOTE — ED Provider Notes (Signed)
 " [place] EMERGENCY DEPARTMENT AT [facility] Provider Note   CSN: [id number] Arrival date & time: 05/10/24  1141     Patient presents with: Leg Pain   Keith Irwin is a 49 y.o. male.   Patient with history of alcohol use, left tib-fib fracture repaired with surgery (2017) --presents to the emergency department today for evaluation of right lower leg swelling.  Patient was seen in the emergency department for this on 05/04/2024.  Patient had lower extremity DVT study which was negative.  He had a knee x-ray which was negative.  CBC with slightly elevated white blood cell count, CMP was unremarkable.  Question of bursitis.  Patient was placed on doxycycline  and prednisone  which she has been taking.  Nearing end of these prescriptions.  Presents today for ongoing symptoms.  Reports pain and swelling with increasing bruising noted to the right lower leg.  He continues to work, works through the pain.  He keeps it wrapped with an Ace wrap.  He presented for recheck today.  Denies fevers.  No chest pain or shortness of breath.  He denies use of NSAIDs at home.  No thigh tenderness or swelling.       Prior to Admission medications  Medication Sig Start Date End Date Taking? Authorizing Provider  meloxicam  (MOBIC ) 7.5 MG tablet Take 1 tablet (7.5 mg total) by mouth daily. 05/10/24  Yes Desiderio Chew, PA-C  doxycycline  (VIBRAMYCIN ) 100 MG capsule Take 1 capsule (100 mg total) by mouth 2 (two) times daily. 05/04/24   Doretha Folks, MD  hydrOXYzine  (ATARAX /VISTARIL ) 25 MG tablet Take 1 tablet (25 mg total) by mouth every 6 (six) hours as needed for anxiety. 02/24/21   Amponsah, Prosper M, MD  nicotine  (NICODERM CQ  - DOSED IN MG/24 HOURS) 21 mg/24hr patch Place 1 patch (21 mg total) onto the skin daily. 07/28/17   Money, Caron NOVAK, FNP  pantoprazole  (PROTONIX ) 40 MG tablet Take 1 tablet (40 mg total) by mouth daily. 02/24/21   Lou Claretta HERO, MD  predniSONE  (DELTASONE ) 20 MG tablet  Take 2 tablets (40 mg total) by mouth daily. 05/04/24   Doretha Folks, MD  traZODone  (DESYREL ) 50 MG tablet Take 1 tablet (50 mg total) by mouth at bedtime. 02/24/21   Lou Claretta HERO, MD    Allergies: Codeine and Codeine    Review of Systems  Updated Vital Signs BP (!) 148/86 (BP Location: Right Arm)   Pulse (!) 57   Temp 97.6 F (36.4 C) (Oral)   Resp 18   SpO2 100%   Physical Exam Vitals and nursing note reviewed.  Constitutional:      Appearance: He is well-developed.  HENT:     Head: Normocephalic and atraumatic.  Eyes:     Conjunctiva/sclera: Conjunctivae normal.  Cardiovascular:     Pulses:          Dorsalis pedis pulses are 2+ on the right side.       Posterior tibial pulses are 2+ on the right side.  Pulmonary:     Effort: No respiratory distress.  Musculoskeletal:        General: Tenderness present.     Cervical back: Normal range of motion and neck supple.     Right upper leg: No swelling or tenderness.     Left upper leg: No swelling or tenderness.     Right knee: Normal range of motion. Tenderness present.     Left knee: Normal range of motion. No tenderness.  Right lower leg: Edema present.     Left lower leg: No edema.     Right ankle: No tenderness. Normal range of motion.     Left ankle: No tenderness. Normal range of motion.       Legs:     Comments: Right lower extremity: There is mild swelling of the right calf compared to the left.  There is ecchymosis noted from the mid calf down to the right anterior and medial ankle that is mild.  No signs of cellulitis.  No significant warmth.  Patient reports tenderness but is able to tolerate me palpating the entirety of the calf.  I do not see any thigh involvement.  Patient has full range of motion of the knee.  No swelling or erythematous bursa noted.  Full normal range of motion of the ankle.  2+ pulses in the right foot.  Foot appears well perfused.  No signs of compartment syndrome.  Skin:     General: Skin is warm and dry.  Neurological:     Mental Status: He is alert.    **I had taken pictures but unfortunately these did not save   (all labs ordered are listed, but only abnormal results are displayed) Labs Reviewed - No data to display  EKG: None  Radiology: No results found.   Procedures   Medications Ordered in the ED - No data to display  ED Course  Patient seen and examined. History obtained directly from patient.  Reviewed workup from previous visit.  Labs/EKG: None ordered  Imaging: None ordered.  Would consider reevaluation for DVT if swelling had expanded, but this does not seem to be the case.  Exam is more consistent with ecchymosis/hematoma.  Medications/Fluids: None ordered  Most recent vital signs reviewed and are as follows: BP (!) 148/86 (BP Location: Right Arm)   Pulse (!) 57   Temp 97.6 F (36.4 C) (Oral)   Resp 18   SpO2 100%   Initial impression: Right lower extremity swelling and pain.  Appropriate and reassuring workup at last visit.  Home treatment plan: Continue RICE protocol and compression for the next week.  Will trial course of meloxicam  7.5 mg.  Return instructions discussed with patient: Return with worsening pain, redness, swelling especially if it starts extending up towards the thigh.  Follow-up instructions discussed with patient: Follow-up with orthopedist for reevaluation/second opinion, consideration of additional evaluations.                                   Medical Decision Making Risk Prescription drug management.   Patient with right lower leg swelling.  DVT rule out performed at last visit.  I do not see extensive worsening of the leg which would make me want to reevaluate for this.  Overall feel low concern for DVT.  Patient does have ecchymosis that he reports is worse than at previous visit.  This raises concern for a hematoma or possibly a ruptured cyst on the leg.  No evidence of compartment syndrome.   I would expect this to slowly resolve with conservative management over the next several weeks.  No evidence of cellulitis, patient completed course of doxycycline  which would be appropriate for this as well.  I do not feel that labs need to be rechecked.  X-ray of the knee performed at previous visit was negative.  Do not think that he needs x-ray of the lower legs given  no history of trauma.  Encouraged orthopedic evaluation if not improving, given maximal therapy already from the emergency department.  Referral given.  Discussed the case with attending physician who agrees with plan.     Final diagnoses:  Pain and swelling of right lower leg    ED Discharge Orders          Ordered    meloxicam  (MOBIC ) 7.5 MG tablet  Daily        05/10/24 1214               Desiderio Chew, PA-C 05/10/24 1225    Jerrol Agent, MD 05/10/24 1420  "

## 2024-05-10 NOTE — ED Triage Notes (Signed)
 C/o R lower leg swelling x 1 week. Seen here for same on 1/29. States hasn't improved.

## 2024-05-10 NOTE — Discharge Instructions (Addendum)
 Please read and follow all provided instructions.  Your diagnoses today include:  1. Pain and swelling of right lower leg    Tests performed today include: Vital signs. See below for your results today.   Medications prescribed:  Meloxicam  - anti-inflammatory pain medication  You have been prescribed an anti-inflammatory medication or NSAID. Take with food. Do not take aspirin, ibuprofen , or naproxen  if taking this medication. Take smallest effective dose for the shortest duration needed for your pain. Stop taking if you experience stomach pain or vomiting.   Take any prescribed medications only as directed.  Home care instructions:  Follow any educational materials contained in this packet Follow R.I.C.E. Protocol: R - rest your injury  I  - use ice on injury without applying directly to skin C - compress injury with bandage or splint E - elevate the injury as much as possible  Follow-up instructions: Please follow-up with the provided orthopedic physician in 5 days for another opinion, especially if not improving.   Return instructions:  Please return if your toes or feet are numb or tingling, appear gray or blue, or you have severe pain (also elevate the leg and loosen splint or wrap if you were given one) Return if you have pain and swelling that seems to be worsening, especially if it starts extending into the thigh area Please return to the Emergency Department if you experience worsening symptoms.  Please return if you have any other emergent concerns.  Additional Information:  Your vital signs today were: BP (!) 148/86 (BP Location: Right Arm)   Pulse (!) 57   Temp 97.6 F (36.4 C) (Oral)   Resp 18   SpO2 100%  If your blood pressure (BP) was elevated above 135/85 this visit, please have this repeated by your doctor within one month. --------------
